# Patient Record
Sex: Male | Born: 1955
Health system: Southern US, Community
[De-identification: ages and names within clinical notes are randomized; demographics above are authoritative.]

## PROBLEM LIST (undated history)

## (undated) DIAGNOSIS — E785 Hyperlipidemia, unspecified: Secondary | ICD-10-CM

## (undated) DIAGNOSIS — I10 Essential (primary) hypertension: Secondary | ICD-10-CM

## (undated) DIAGNOSIS — E079 Disorder of thyroid, unspecified: Secondary | ICD-10-CM

## (undated) DIAGNOSIS — T7840XA Allergy, unspecified, initial encounter: Secondary | ICD-10-CM

## (undated) DIAGNOSIS — E559 Vitamin D deficiency, unspecified: Secondary | ICD-10-CM

## (undated) DIAGNOSIS — E0500 Thyrotoxicosis with diffuse goiter without thyrotoxic crisis or storm: Secondary | ICD-10-CM

## (undated) DIAGNOSIS — Z86010 Personal history of colonic polyps: Secondary | ICD-10-CM

## (undated) HISTORY — DX: Vitamin D deficiency, unspecified: E55.9

## (undated) HISTORY — DX: Essential (primary) hypertension: I10

## (undated) HISTORY — DX: Disorder of thyroid, unspecified: E07.9

## (undated) HISTORY — PX: TONSILLECTOMY AND ADENOIDECTOMY: SUR1326

## (undated) HISTORY — DX: Thyrotoxicosis with diffuse goiter without thyrotoxic crisis or storm: E05.00

## (undated) HISTORY — DX: Hyperlipidemia, unspecified: E78.5

## (undated) HISTORY — DX: Allergy, unspecified, initial encounter: T78.40XA

## (undated) HISTORY — DX: Personal history of colonic polyps: Z86.010

---

## 1997-05-06 ENCOUNTER — Ambulatory Visit (HOSPITAL_COMMUNITY): Admission: RE | Admit: 1997-05-06 | Discharge: 1997-05-06 | Payer: Self-pay | Admitting: *Deleted

## 2001-05-24 ENCOUNTER — Ambulatory Visit (HOSPITAL_COMMUNITY): Admission: RE | Admit: 2001-05-24 | Discharge: 2001-05-24 | Payer: Self-pay | Admitting: Anesthesiology

## 2001-05-24 ENCOUNTER — Encounter: Payer: Self-pay | Admitting: Internal Medicine

## 2002-05-28 ENCOUNTER — Ambulatory Visit (HOSPITAL_COMMUNITY): Admission: RE | Admit: 2002-05-28 | Discharge: 2002-05-28 | Payer: Self-pay | Admitting: Internal Medicine

## 2002-05-28 ENCOUNTER — Encounter: Payer: Self-pay | Admitting: Internal Medicine

## 2007-09-05 ENCOUNTER — Ambulatory Visit (HOSPITAL_COMMUNITY): Admission: RE | Admit: 2007-09-05 | Discharge: 2007-09-05 | Payer: Self-pay | Admitting: Internal Medicine

## 2007-09-25 ENCOUNTER — Ambulatory Visit: Payer: Self-pay

## 2010-09-23 ENCOUNTER — Other Ambulatory Visit (HOSPITAL_COMMUNITY): Payer: Self-pay | Admitting: Internal Medicine

## 2010-09-23 ENCOUNTER — Ambulatory Visit (HOSPITAL_COMMUNITY)
Admission: RE | Admit: 2010-09-23 | Discharge: 2010-09-23 | Disposition: A | Discharge status: Home / Self Care | Payer: Managed Care, Other (non HMO) | Source: Ambulatory Visit | Attending: Internal Medicine | Admitting: Internal Medicine

## 2010-09-23 DIAGNOSIS — I10 Essential (primary) hypertension: Secondary | ICD-10-CM | POA: Insufficient documentation

## 2010-09-23 DIAGNOSIS — Z0000 Encounter for general adult medical examination without abnormal findings: Secondary | ICD-10-CM | POA: Insufficient documentation

## 2012-09-26 ENCOUNTER — Other Ambulatory Visit (HOSPITAL_COMMUNITY): Payer: Self-pay | Admitting: Internal Medicine

## 2012-09-26 ENCOUNTER — Ambulatory Visit (HOSPITAL_COMMUNITY)
Admission: RE | Admit: 2012-09-26 | Discharge: 2012-09-26 | Disposition: A | Discharge status: Home / Self Care | Payer: 59 | Source: Ambulatory Visit | Attending: Internal Medicine | Admitting: Internal Medicine

## 2012-09-26 DIAGNOSIS — R05 Cough: Secondary | ICD-10-CM

## 2012-09-26 DIAGNOSIS — J984 Other disorders of lung: Secondary | ICD-10-CM | POA: Insufficient documentation

## 2012-09-26 DIAGNOSIS — R059 Cough, unspecified: Secondary | ICD-10-CM

## 2012-10-01 ENCOUNTER — Other Ambulatory Visit: Payer: Self-pay | Admitting: Internal Medicine

## 2012-10-01 DIAGNOSIS — I890 Lymphedema, not elsewhere classified: Secondary | ICD-10-CM

## 2012-10-01 DIAGNOSIS — E049 Nontoxic goiter, unspecified: Secondary | ICD-10-CM

## 2012-10-02 ENCOUNTER — Ambulatory Visit
Admission: RE | Admit: 2012-10-02 | Discharge: 2012-10-02 | Disposition: A | Discharge status: Home / Self Care | Payer: 59 | Source: Ambulatory Visit | Attending: Internal Medicine | Admitting: Internal Medicine

## 2012-10-02 DIAGNOSIS — E049 Nontoxic goiter, unspecified: Secondary | ICD-10-CM

## 2012-10-02 DIAGNOSIS — I890 Lymphedema, not elsewhere classified: Secondary | ICD-10-CM

## 2013-03-27 DIAGNOSIS — E785 Hyperlipidemia, unspecified: Secondary | ICD-10-CM | POA: Insufficient documentation

## 2013-03-27 DIAGNOSIS — I10 Essential (primary) hypertension: Secondary | ICD-10-CM | POA: Insufficient documentation

## 2013-03-27 DIAGNOSIS — E039 Hypothyroidism, unspecified: Secondary | ICD-10-CM | POA: Insufficient documentation

## 2013-03-27 DIAGNOSIS — E559 Vitamin D deficiency, unspecified: Secondary | ICD-10-CM | POA: Insufficient documentation

## 2013-03-31 DIAGNOSIS — Z79899 Other long term (current) drug therapy: Secondary | ICD-10-CM | POA: Insufficient documentation

## 2013-03-31 NOTE — Progress Notes (Signed)
Patient ID: Julian Lee, male   DOB: 1955/02/10, 58 y.o.   MRN: [id number]      This very nice 58 y.o. male presents for 6 month follow up with Hypertension, Hyperlipidemia, Pre-Diabetes Screening and Vitamin D Deficiency.    HTN predates since the 1990's. BP has been controlled at home. Today's BP: 134/82 mmHg . Patient denies any cardiac type chest pain, palpitations, dyspnea/orthopnea/PND, dizziness, claudication, or dependent edema. Exercises 5-7 days/week.   Hyperlipidemia is controlled with diet. Last Cholesterol was 133, Triglycerides were  73, HDL 51 and LDL 67 in Sept 2014 - all at goal. Patient denies myalgias or other med SE's.    Also, the patient is screened for PreDiabetes/insulin resistance with last A1c of 5.3% in Sept 2014. Patient denies any symptoms of reactive hypoglycemia, diabetic polys, paresthesias or visual blurring.   Further, Patient has history of Vitamin D Deficiency with last vitamin D of 74 in Sept 2014. Patient supplements vitamin D without any suspected side-effects.  Medication Sig  . BABY ASPIRIN PO Take 81 mg by mouth daily.  . Calcium Carbonate Antacid (TUMS PO) Take by mouth as needed.  . Cholecalciferol (VITAMIN D PO) Take 2,000 Units by mouth 3 (three) times daily.   . Flaxseed, Linseed, (FLAX SEED OIL PO) Take by mouth daily. Takes 2 caps daily  . levothyroxine (SYNTHROID) 150 MCG tablet Take 150 mcg by mouth daily before breakfast.  . losartan (COZAAR) 100 MG tablet Take 100 mg by mouth daily.    No Known Allergies  PMHx:   Past Medical History  Diagnosis Date  . Hypertension   . Hyperlipidemia   . Thyroid disease   . Vitamin D deficiency     FHx:    Reviewed / unchanged  SHx:    Reviewed / unchanged   Systems Review: Constitutional: Denies fever, chills, wt changes, headaches, insomnia, fatigue, night sweats, change in appetite. Eyes: Denies redness, blurred vision, diplopia, discharge, itchy, watery eyes.  ENT: Denies  discharge, congestion, post nasal drip, epistaxis, sore throat, earache, hearing loss, dental pain, tinnitus, vertigo, sinus pain, snoring.  CV: Denies chest pain, palpitations, irregular heartbeat, syncope, dyspnea, diaphoresis, orthopnea, PND, claudication, edema. Respiratory: denies cough, dyspnea, DOE, pleurisy, hoarseness, laryngitis, wheezing.  Gastrointestinal: Denies dysphagia, odynophagia, heartburn, reflux, water brash, abdominal pain or cramps, nausea, vomiting, bloating, diarrhea, constipation, hematemesis, melena, hematochezia,  or hemorrhoids. Genitourinary: Denies dysuria, frequency, urgency, nocturia, hesitancy, discharge, hematuria, flank pain. Musculoskeletal: Denies arthralgias, myalgias, stiffness, jt. swelling, pain, limp, strain/sprain.  Skin: Denies pruritus, rash, hives, warts, acne, eczema, change in skin lesion(s). Neuro: No weakness, tremor, incoordination, spasms, paresthesia, or pain. Psychiatric: Denies confusion, memory loss, or sensory loss. Endo: Denies change in weight, skin, hair change.  Heme/Lymph: No excessive bleeding, bruising, orenlarged lymph nodes.   Exam:  BP 134/82  Pulse 64  Temp(Src) 98.2 F (36.8 C) (Temporal)  Resp 16  Ht 6' 1.75" (1.873 m)  Wt 219 lb 3.2 oz (99.428 kg)  BMI 28.34 kg/m2  Appears well nourished - in no distress. Eyes: PERRLA, EOMs, conjunctiva no swelling or erythema. Sinuses: No frontal/maxillary tenderness ENT/Mouth: EAC's clear, TM's nl w/o erythema, bulging. Nares clear w/o erythema, swelling, exudates. Oropharynx clear without erythema or exudates. Oral hygiene is good. Tongue normal, non obstructing. Hearing intact.  Neck: Supple. Thyroid nl. Car 2+/2+ without bruits, nodes or JVD. Chest: Respirations nl with BS clear & equal w/o rales, rhonchi, wheezing or stridor.  Cor: Heart sounds normal w/ regular rate and rhythm without  sig. murmurs, gallops, clicks, or rubs. Peripheral pulses normal and equal  without edema.   Abdomen: Soft & bowel sounds normal. Non-tender w/o guarding, rebound, hernias, masses, or organomegaly.  Lymphatics: Unremarkable.  Musculoskeletal: Full ROM all peripheral extremities, joint stability, 5/5 strength, and normal gait.  Skin: Warm, dry without exposed rashes, lesions, ecchymosis apparent.  Neuro: Cranial nerves intact, reflexes equal bilaterally. Sensory-motor testing grossly intact. Tendon reflexes grossly intact.  Pysch: Alert & oriented x 3. Insight and judgement nl & appropriate. No ideations.  Assessment and Plan:  1. Hypertension - Continue monitor blood pressure at home. Continue diet/meds same.  2. Hyperlipidemia - Continue diet, exercise,& lifestyle modifications. Continue monitor periodic cholesterol/liver & renal functions   3. Pre-diabetes/Insulin Resistance  screening - Continue diet, exercise, lifestyle modifications. Monitor appropriate labs.  4. Vitamin D Deficiency - Continue supplementation.  5. Hypothyroidism - continue to monitor  Recommended regular exercise, BP monitoring, weight control, and discussed med and SE's. Recommended labs to assess and monitor clinical status. Further disposition pending results of labs.

## 2013-03-31 NOTE — Patient Instructions (Signed)

## 2013-04-01 ENCOUNTER — Encounter: Payer: Self-pay | Admitting: Internal Medicine

## 2013-04-01 ENCOUNTER — Encounter (INDEPENDENT_AMBULATORY_CARE_PROVIDER_SITE_OTHER): Payer: Self-pay

## 2013-04-01 ENCOUNTER — Ambulatory Visit (INDEPENDENT_AMBULATORY_CARE_PROVIDER_SITE_OTHER): Payer: 59 | Admitting: Internal Medicine

## 2013-04-01 VITALS — BP 134/82 | HR 64 | Temp 98.2°F | Resp 16 | Ht 73.75 in | Wt 219.2 lb

## 2013-04-01 DIAGNOSIS — I10 Essential (primary) hypertension: Secondary | ICD-10-CM

## 2013-04-01 DIAGNOSIS — Z79899 Other long term (current) drug therapy: Secondary | ICD-10-CM

## 2013-04-01 DIAGNOSIS — E785 Hyperlipidemia, unspecified: Secondary | ICD-10-CM

## 2013-04-01 DIAGNOSIS — E039 Hypothyroidism, unspecified: Secondary | ICD-10-CM

## 2013-04-01 DIAGNOSIS — Z111 Encounter for screening for respiratory tuberculosis: Secondary | ICD-10-CM

## 2013-04-01 DIAGNOSIS — R7309 Other abnormal glucose: Secondary | ICD-10-CM

## 2013-04-01 DIAGNOSIS — E559 Vitamin D deficiency, unspecified: Secondary | ICD-10-CM

## 2013-04-02 LAB — CBC WITH DIFFERENTIAL/PLATELET
BASOS ABS: 0.1 10*3/uL (ref 0.0–0.1)
BASOS PCT: 1 % (ref 0–1)
EOS ABS: 0.3 10*3/uL (ref 0.0–0.7)
Eosinophils Relative: 5 % (ref 0–5)
HCT: 47.6 % (ref 39.0–52.0)
Hemoglobin: 16 g/dL (ref 13.0–17.0)
Lymphocytes Relative: 17 % (ref 12–46)
Lymphs Abs: 1 10*3/uL (ref 0.7–4.0)
MCH: 30.2 pg (ref 26.0–34.0)
MCHC: 33.6 g/dL (ref 30.0–36.0)
MCV: 89.8 fL (ref 78.0–100.0)
Monocytes Absolute: 0.4 10*3/uL (ref 0.1–1.0)
Monocytes Relative: 6 % (ref 3–12)
NEUTROS PCT: 71 % (ref 43–77)
Neutro Abs: 4.3 10*3/uL (ref 1.7–7.7)
PLATELETS: 208 10*3/uL (ref 150–400)
RBC: 5.3 MIL/uL (ref 4.22–5.81)
RDW: 13.5 % (ref 11.5–15.5)
WBC: 6 10*3/uL (ref 4.0–10.5)

## 2013-04-02 LAB — VITAMIN D 25 HYDROXY (VIT D DEFICIENCY, FRACTURES): Vit D, 25-Hydroxy: 94 ng/mL — ABNORMAL HIGH (ref 30–89)

## 2013-04-02 LAB — LIPID PANEL
CHOL/HDL RATIO: 2.4 ratio
CHOLESTEROL: 152 mg/dL (ref 0–200)
HDL: 64 mg/dL (ref >39)
LDL Cholesterol: 73 mg/dL (ref 0–99)
TRIGLYCERIDES: 74 mg/dL (ref <150)
VLDL: 15 mg/dL (ref 0–40)

## 2013-04-02 LAB — HEMOGLOBIN A1C
Hgb A1c MFr Bld: 5.2 % (ref <5.7)
Mean Plasma Glucose: 103 mg/dL (ref <117)

## 2013-04-02 LAB — HEPATIC FUNCTION PANEL
ALT: 16 U/L (ref 0–53)
AST: 21 U/L (ref 0–37)
Albumin: 4.3 g/dL (ref 3.5–5.2)
Alkaline Phosphatase: 59 U/L (ref 39–117)
BILIRUBIN INDIRECT: 0.5 mg/dL (ref 0.2–1.2)
BILIRUBIN TOTAL: 0.6 mg/dL (ref 0.2–1.2)
Bilirubin, Direct: 0.1 mg/dL (ref 0.0–0.3)
Total Protein: 7 g/dL (ref 6.0–8.3)

## 2013-04-02 LAB — BASIC METABOLIC PANEL WITH GFR
BUN: 22 mg/dL (ref 6–23)
CALCIUM: 9.5 mg/dL (ref 8.4–10.5)
CO2: 26 mEq/L (ref 19–32)
Chloride: 105 mEq/L (ref 96–112)
Creat: 1.15 mg/dL (ref 0.50–1.35)
GFR, EST AFRICAN AMERICAN: 80 mL/min
GFR, EST NON AFRICAN AMERICAN: 69 mL/min
Glucose, Bld: 91 mg/dL (ref 70–99)
Potassium: 4.3 mEq/L (ref 3.5–5.3)
Sodium: 140 mEq/L (ref 135–145)

## 2013-04-02 LAB — MAGNESIUM: Magnesium: 1.9 mg/dL (ref 1.5–2.5)

## 2013-04-02 LAB — INSULIN, FASTING: Insulin fasting, serum: 9 u[IU]/mL (ref 3–28)

## 2013-04-02 LAB — TSH: TSH: 3.932 u[IU]/mL (ref 0.350–4.500)

## 2013-04-03 ENCOUNTER — Telehealth: Payer: Self-pay | Admitting: Internal Medicine

## 2013-04-03 NOTE — Telephone Encounter (Signed)
Pt called to advise TB test negative.

## 2013-04-04 LAB — TB SKIN TEST
INDURATION: 0 mm
TB SKIN TEST: NEGATIVE

## 2013-04-09 ENCOUNTER — Other Ambulatory Visit: Payer: Self-pay | Admitting: Internal Medicine

## 2013-04-09 ENCOUNTER — Other Ambulatory Visit: Payer: Self-pay | Admitting: *Deleted

## 2013-04-09 NOTE — Addendum Note (Signed)
Addended by: Unk Pinto on: 04/09/2013 12:45 PM   Modules accepted: Orders

## 2013-04-16 ENCOUNTER — Other Ambulatory Visit: Payer: 59 | Admitting: Internal Medicine

## 2013-04-16 DIAGNOSIS — I10 Essential (primary) hypertension: Secondary | ICD-10-CM

## 2013-05-21 ENCOUNTER — Other Ambulatory Visit: Payer: Self-pay | Admitting: Internal Medicine

## 2013-05-22 ENCOUNTER — Other Ambulatory Visit: Payer: Self-pay | Admitting: Internal Medicine

## 2013-06-06 ENCOUNTER — Encounter: Payer: Self-pay | Admitting: Internal Medicine

## 2013-07-04 ENCOUNTER — Other Ambulatory Visit: Payer: Self-pay | Admitting: Physician Assistant

## 2013-09-07 ENCOUNTER — Other Ambulatory Visit: Payer: Self-pay | Admitting: Physician Assistant

## 2013-09-26 ENCOUNTER — Encounter: Payer: Self-pay | Admitting: Physician Assistant

## 2013-10-01 ENCOUNTER — Encounter: Payer: Self-pay | Admitting: Physician Assistant

## 2013-10-01 ENCOUNTER — Ambulatory Visit (INDEPENDENT_AMBULATORY_CARE_PROVIDER_SITE_OTHER): Payer: 59 | Admitting: Physician Assistant

## 2013-10-01 VITALS — BP 142/90 | HR 56 | Temp 97.9°F | Resp 16 | Ht 72.5 in | Wt 231.0 lb

## 2013-10-01 DIAGNOSIS — Z0000 Encounter for general adult medical examination without abnormal findings: Secondary | ICD-10-CM

## 2013-10-01 LAB — CBC WITH DIFFERENTIAL/PLATELET
BASOS PCT: 1 % (ref 0–1)
Basophils Absolute: 0.1 10*3/uL (ref 0.0–0.1)
Eosinophils Absolute: 0.4 10*3/uL (ref 0.0–0.7)
Eosinophils Relative: 7 % — ABNORMAL HIGH (ref 0–5)
HCT: 45.6 % (ref 39.0–52.0)
Hemoglobin: 15.5 g/dL (ref 13.0–17.0)
LYMPHS ABS: 1 10*3/uL (ref 0.7–4.0)
Lymphocytes Relative: 19 % (ref 12–46)
MCH: 30.1 pg (ref 26.0–34.0)
MCHC: 34 g/dL (ref 30.0–36.0)
MCV: 88.5 fL (ref 78.0–100.0)
Monocytes Absolute: 0.6 10*3/uL (ref 0.1–1.0)
Monocytes Relative: 12 % (ref 3–12)
NEUTROS PCT: 61 % (ref 43–77)
Neutro Abs: 3.2 10*3/uL (ref 1.7–7.7)
PLATELETS: 217 10*3/uL (ref 150–400)
RBC: 5.15 MIL/uL (ref 4.22–5.81)
RDW: 13.7 % (ref 11.5–15.5)
WBC: 5.3 10*3/uL (ref 4.0–10.5)

## 2013-10-01 NOTE — Progress Notes (Signed)
Complete Physical  Assessment and Plan: Hypertension- - continue medications, DASH diet, exercise and monitor at home. Call if greater than 140/90. ? White coat syndrome  Hyperlipidemia--continue medications, check lipids, decrease fatty foods, increase activity.   -Hypothyroidism-check TSH level, continue medications the same.   Vitamin D deficiency- cont supplement  Get colonoscopy Get eye exam  Discussed med's effects and SE's. Screening labs and tests as requested with regular follow-up as recommended.  HPI He has had elevated blood pressure for 15 + years. His blood pressure has not been checked at home but he admits to white coat syndrome, he is on losartan 100mg  daily, today their BP is BP: 142/90 mmHg He does workout, does cardio 45 mins daily. He denies chest pain, shortness of breath, dizziness.  He is not on cholesterol medication and denies myalgias. His cholesterol is at goal. The cholesterol last visit was:   Lab Results  Component Value Date   CHOL 152 04/01/2013   HDL 64 04/01/2013   LDLCALC 73 04/01/2013   TRIG 74 04/01/2013   CHOLHDL 2.4 04/01/2013    Last A1C in the office was:  Lab Results  Component Value Date   HGBA1C 5.2 04/01/2013   Patient is on Vitamin D supplement.   Lab Results  Component Value Date   VD25OH 31* 04/01/2013     He is on thyroid medication. His medication was not changed last visit. Patient denies nervousness, palpitations and weight changes.  Lab Results  Component Value Date   TSH 3.932 04/01/2013  .    Current Medications:  Current Outpatient Prescriptions on File Prior to Visit  Medication Sig Dispense Refill  . BABY ASPIRIN PO Take 81 mg by mouth daily.      . Calcium Carbonate Antacid (TUMS PO) Take by mouth as needed.      . Cholecalciferol (VITAMIN D PO) Take 2,000 Units by mouth 3 (three) times daily.       . Flaxseed, Linseed, (FLAX SEED OIL PO) Take by mouth daily. Takes 2 caps daily      . levothyroxine (SYNTHROID,  LEVOTHROID) 150 MCG tablet Take 1 tablet by mouth  daily  30 tablet  0  . losartan (COZAAR) 100 MG tablet Take 1 tablet by mouth  every day for blood  pressure  90 tablet  2   No current facility-administered medications on file prior to visit.   Health Maintenance:  Immunization History  Administered Date(s) Administered  . Influenza Split 09/26/2012  . PPD Test 04/01/2013  . Pneumococcal Polysaccharide-23 09/16/2008  . Td 08/22/2006  . Zoster 01/10/2006   Tetanus: 2008 Pneumovax: 2010 Flu vaccine: 2014 DUE will get at work Zostavax: 2008 DEXA: Colonoscopy:2005 EGD: Cardiolite negative 2009 DEE: 5 years ago  Allergies: No Known Allergies Medical History:  Past Medical History  Diagnosis Date  . Hypertension   . Hyperlipidemia   . Thyroid disease   . Vitamin D deficiency    Surgical History:  Past Surgical History  Procedure Laterality Date  . Tonsillectomy and adenoidectomy     Family History:  Family History  Problem Relation Age of Onset  . Cancer Mother     lung  . Heart attack Father    Social History:   History  Substance Use Topics  . Smoking status: Former Research scientist (life sciences)  . Smokeless tobacco: Former Systems developer    Quit date: 01/10/1990  . Alcohol Use: Yes     Comment: Rare   ROS:  [X]  = complains of  [ ]  =  denies  General: Fatigue [ ]  Fever [ ]  Chills [ ]  Weakness [ ]   Insomnia [ ]  Weight change [ ]  Night sweats [ ]   Change in appetite [ ]  Eyes: Redness [ ]  Blurred vision [ ]  Diplopia [ ]  Discharge [ ]   ENT: Congestion [ ]  Sinus Pain [ ]  Post Nasal Drip [ ]  Sore Throat [ ]  Earache [ ]  hearing loss [ ]  Tinnitus [ ]  Snoring [ ]   Cardiac: Chest pain/pressure [ ]  SOB [ ]  Orthopnea [ ]   Palpitations [ ]   Paroxysmal nocturnal dyspnea[ ]  Claudication [ ]  Edema [ ]   Pulmonary: Cough [ ]  Wheezing[ ]   SOB [ ]   Pleurisy [ ]   GI: Nausea [ ]  Vomiting[ ]  Dysphagia[ ]  Heartburn[ ]  Abdominal pain [ ]  Constipation [ ] ; Diarrhea [ ]  BRBPR [ ]  Melena[ ]  Bloating [ ]  Hemorrhoids [ ]    GU: Hematuria[ ]  Dysuria [ ]  Nocturia[ ]  Urgency [ ]   Hesitancy [ ]  Discharge [ ]  Frequency [ ]   Neuro: Headaches[ ]  Vertigo[ ]  Paresthesias[ ]  Spasm [ ]  Speech changes [ ]  Incoordination [ ]   Ortho: Arthritis [ ]  Joint pain [ ]  Muscle pain [ ]  Joint swelling [ ]  Back Pain [ ]  Skin:  Rash [ ]   Pruritis [ ]  Change in skin lesion [ ]   Psych: Depression[ ]  Anxiety[ ]  Confusion [ ]  Memory loss [ ]   Heme/Lypmh: Bleeding [ ]  Bruising [ ]  Enlarged lymph nodes [ ]   Endocrine: Visual blurring [ ]  Paresthesia [ ]  Polyuria [ ]  Polydypsea [ ]    Heat/cold intolerance [ ]  Hypoglycemia [ ]   Physical Exam: Estimated body mass index is 30.88 kg/(m^2) as calculated from the following:   Height as of this encounter: 6' 0.5" (1.842 m).   Weight as of this encounter: 231 lb (104.781 kg). BP 142/90  Pulse 56  Temp(Src) 97.9 F (36.6 C)  Resp 16  Ht 6' 0.5" (1.842 m)  Wt 231 lb (104.781 kg)  BMI 30.88 kg/m2 General Appearance: Well nourished, in no apparent distress. Eyes: PERRLA, EOMs, conjunctiva no swelling or erythema, normal fundi and vessels. Sinuses: No Frontal/maxillary tenderness ENT/Mouth: Ext aud canals clear, normal light reflex with TMs without erythema, bulging. Good dentition. No erythema, swelling, or exudate on post pharynx. Tonsils not swollen or erythematous. Hearing normal.  Neck: Supple, thyroid normal. No bruits Respiratory: Respiratory effort normal, BS equal bilaterally without rales, rhonchi, wheezing or stridor. Cardio: RRR without murmurs, rubs or gallops. Brisk peripheral pulses without edema.  Chest: symmetric, with normal excursions and percussion. Abdomen: Soft, +BS. Non tender, no guarding, rebound, hernias, masses, or organomegaly. .  Lymphatics: Non tender without lymphadenopathy.  Genitourinary: defer Musculoskeletal: Full ROM all peripheral extremities,5/5 strength, and normal gait. Skin: Warm, dry without rashes, lesions, ecchymosis.  Neuro: Cranial nerves intact,  reflexes equal bilaterally. Normal muscle tone, no cerebellar symptoms. Sensation intact.  Psych: Awake and oriented X 3, normal affect, Insight and Judgment appropriate.   EKG: done 03/2013 normal  Vicie Mutters 3:56 PM

## 2013-10-01 NOTE — Patient Instructions (Signed)
Please call Dr. Celesta Aver office to schedule colonoscopy Monitor BP if it is still elevated  Above 140/90 call the office.   DASH Eating Plan DASH stands for "Dietary Approaches to Stop Hypertension." The DASH eating plan is a healthy eating plan that has been shown to reduce high blood pressure (hypertension). Additional health benefits may include reducing the risk of type 2 diabetes mellitus, heart disease, and stroke. The DASH eating plan may also help with weight loss. WHAT DO I NEED TO KNOW ABOUT THE DASH EATING PLAN? For the DASH eating plan, you will follow these general guidelines:  Choose foods with a percent daily value for sodium of less than 5% (as listed on the food label).  Use salt-free seasonings or herbs instead of table salt or sea salt.  Check with your health care provider or pharmacist before using salt substitutes.  Eat lower-sodium products, often labeled as "lower sodium" or "no salt added."  Eat fresh foods.  Eat more vegetables, fruits, and low-fat dairy products.  Choose whole grains. Look for the word "whole" as the first word in the ingredient list.  Choose fish and skinless chicken or [place] more often than red meat. Limit fish, poultry, and meat to 6 oz (170 g) each day.  Limit sweets, desserts, sugars, and sugary drinks.  Choose heart-healthy fats.  Limit cheese to 1 oz (28 g) per day.  Eat more home-cooked food and less restaurant, buffet, and fast food.  Limit fried foods.  Cook foods using methods other than frying.  Limit canned vegetables. If you do use them, rinse them well to decrease the sodium.  When eating at a restaurant, ask that your food be prepared with less salt, or no salt if possible. WHAT FOODS CAN I EAT? Seek help from a dietitian for individual calorie needs. Grains Whole grain or whole wheat bread. Brown rice. Whole grain or whole wheat pasta. Quinoa, bulgur, and whole grain cereals. Low-sodium cereals. Corn or whole  wheat flour tortillas. Whole grain cornbread. Whole grain crackers. Low-sodium crackers. Vegetables Fresh or frozen vegetables (raw, steamed, roasted, or grilled). Low-sodium or reduced-sodium tomato and vegetable juices. Low-sodium or reduced-sodium tomato sauce and paste. Low-sodium or reduced-sodium canned vegetables.  Fruits All fresh, canned (in natural juice), or frozen fruits. Meat and Other Protein Products Ground beef (85% or leaner), grass-fed beef, or beef trimmed of fat. Skinless chicken or [place]. Ground chicken or [place]. Pork trimmed of fat. All fish and seafood. Eggs. Dried beans, peas, or lentils. Unsalted nuts and seeds. Unsalted canned beans. Dairy Low-fat dairy products, such as skim or 1% milk, 2% or reduced-fat cheeses, low-fat ricotta or cottage cheese, or plain low-fat yogurt. Low-sodium or reduced-sodium cheeses. Fats and Oils Tub margarines without trans fats. Light or reduced-fat mayonnaise and salad dressings (reduced sodium). Avocado. Safflower, olive, or canola oils. Natural peanut or almond butter. Other Unsalted popcorn and pretzels. The items listed above may not be a complete list of recommended foods or beverages. Contact your dietitian for more options. WHAT FOODS ARE NOT RECOMMENDED? Grains White bread. White pasta. White rice. Refined cornbread. Bagels and croissants. Crackers that contain trans fat. Vegetables Creamed or fried vegetables. Vegetables in a cheese sauce. Regular canned vegetables. Regular canned tomato sauce and paste. Regular tomato and vegetable juices. Fruits Dried fruits. Canned fruit in light or heavy syrup. Fruit juice. Meat and Other Protein Products Fatty cuts of meat. Ribs, chicken wings, bacon, sausage, bologna, salami, chitterlings, fatback, hot dogs, bratwurst, and packaged luncheon meats.  Salted nuts and seeds. Canned beans with salt. Dairy Whole or 2% milk, cream, half-and-half, and cream cheese. Whole-fat or sweetened yogurt.  Full-fat cheeses or blue cheese. Nondairy creamers and whipped toppings. Processed cheese, cheese spreads, or cheese curds. Condiments Onion and garlic salt, seasoned salt, table salt, and sea salt. Canned and packaged gravies. Worcestershire sauce. Tartar sauce. Barbecue sauce. Teriyaki sauce. Soy sauce, including reduced sodium. Steak sauce. Fish sauce. Oyster sauce. Cocktail sauce. Horseradish. Ketchup and mustard. Meat flavorings and tenderizers. Bouillon cubes. Hot sauce. Tabasco sauce. Marinades. Taco seasonings. Relishes. Fats and Oils Butter, stick margarine, lard, shortening, ghee, and bacon fat. Coconut, palm kernel, or palm oils. Regular salad dressings. Other Pickles and olives. Salted popcorn and pretzels. The items listed above may not be a complete list of foods and beverages to avoid. Contact your dietitian for more information. WHERE CAN I FIND MORE INFORMATION? National Heart, Lung, and Blood Institute: travelstabloid.com Document Released: 12/16/2010 Document Revised: 05/13/2013 Document Reviewed: 10/31/2012 [employer] Patient Information 2015 [place], [place]. This information is not intended to replace advice given to you by your health care provider. Make sure you discuss any questions you have with your health care provider.

## 2013-10-02 LAB — MICROALBUMIN / CREATININE URINE RATIO
Creatinine, Urine: 123.8 mg/dL
MICROALB UR: 4.2 mg/dL — AB (ref <2.0)
MICROALB/CREAT RATIO: 33.9 mg/g — AB (ref 0.0–30.0)

## 2013-10-02 LAB — BASIC METABOLIC PANEL WITH GFR
BUN: 11 mg/dL (ref 6–23)
CALCIUM: 9.6 mg/dL (ref 8.4–10.5)
CO2: 28 meq/L (ref 19–32)
Chloride: 102 mEq/L (ref 96–112)
Creat: 1.09 mg/dL (ref 0.50–1.35)
GFR, EST AFRICAN AMERICAN: 85 mL/min
GFR, Est Non African American: 73 mL/min
Glucose, Bld: 82 mg/dL (ref 70–99)
Potassium: 4.5 mEq/L (ref 3.5–5.3)
SODIUM: 139 meq/L (ref 135–145)

## 2013-10-02 LAB — HEMOGLOBIN A1C
HEMOGLOBIN A1C: 5.2 % (ref <5.7)
Mean Plasma Glucose: 103 mg/dL (ref <117)

## 2013-10-02 LAB — IRON AND TIBC
%SAT: 30 % (ref 20–55)
Iron: 87 ug/dL (ref 42–165)
TIBC: 287 ug/dL (ref 215–435)
UIBC: 200 ug/dL (ref 125–400)

## 2013-10-02 LAB — HEPATIC FUNCTION PANEL
ALT: 17 U/L (ref 0–53)
AST: 24 U/L (ref 0–37)
Albumin: 4.4 g/dL (ref 3.5–5.2)
Alkaline Phosphatase: 58 U/L (ref 39–117)
BILIRUBIN DIRECT: 0.2 mg/dL (ref 0.0–0.3)
BILIRUBIN TOTAL: 0.6 mg/dL (ref 0.2–1.2)
Indirect Bilirubin: 0.4 mg/dL (ref 0.2–1.2)
Total Protein: 7.1 g/dL (ref 6.0–8.3)

## 2013-10-02 LAB — URINALYSIS, ROUTINE W REFLEX MICROSCOPIC
BILIRUBIN URINE: NEGATIVE
GLUCOSE, UA: NEGATIVE mg/dL
Hgb urine dipstick: NEGATIVE
Ketones, ur: NEGATIVE mg/dL
Leukocytes, UA: NEGATIVE
Nitrite: NEGATIVE
Protein, ur: NEGATIVE mg/dL
Specific Gravity, Urine: 1.017 (ref 1.005–1.030)
Urobilinogen, UA: 0.2 mg/dL (ref 0.0–1.0)
pH: 7 (ref 5.0–8.0)

## 2013-10-02 LAB — LIPID PANEL
Cholesterol: 146 mg/dL (ref 0–200)
HDL: 54 mg/dL (ref >39)
LDL CALC: 75 mg/dL (ref 0–99)
Total CHOL/HDL Ratio: 2.7 Ratio
Triglycerides: 84 mg/dL (ref <150)
VLDL: 17 mg/dL (ref 0–40)

## 2013-10-02 LAB — PSA: PSA: 0.7 ng/mL (ref <=4.00)

## 2013-10-02 LAB — VITAMIN D 25 HYDROXY (VIT D DEFICIENCY, FRACTURES): Vit D, 25-Hydroxy: 82 ng/mL (ref 30–89)

## 2013-10-02 LAB — VITAMIN B12: VITAMIN B 12: 930 pg/mL — AB (ref 211–911)

## 2013-10-02 LAB — TSH: TSH: 4.955 u[IU]/mL — ABNORMAL HIGH (ref 0.350–4.500)

## 2013-10-02 LAB — INSULIN, FASTING: Insulin fasting, serum: 3.1 u[IU]/mL (ref 2.0–19.6)

## 2013-10-02 LAB — MAGNESIUM: Magnesium: 2 mg/dL (ref 1.5–2.5)

## 2013-11-06 ENCOUNTER — Ambulatory Visit (INDEPENDENT_AMBULATORY_CARE_PROVIDER_SITE_OTHER): Payer: 59 | Admitting: *Deleted

## 2013-11-06 DIAGNOSIS — E039 Hypothyroidism, unspecified: Secondary | ICD-10-CM

## 2013-11-06 LAB — TSH: TSH: 1.353 u[IU]/mL (ref 0.350–4.500)

## 2013-11-06 NOTE — Progress Notes (Signed)
Patient ID: Julian Lee, male   DOB: 1955/09/03, 58 y.o.   MRN: [id number] Patient presents for 1 month TSH recheck.  Patient currently taking 150 mcg 1 pill daily and 1.5 pills Sat and 1.5 pills Wednesday.

## 2013-12-11 ENCOUNTER — Other Ambulatory Visit: Payer: Self-pay

## 2013-12-11 MED ORDER — LEVOTHYROXINE SODIUM 150 MCG PO TABS: ORAL_TABLET | ORAL | Status: DC

## 2014-01-03 ENCOUNTER — Other Ambulatory Visit: Payer: Self-pay | Admitting: Physician Assistant

## 2014-04-02 ENCOUNTER — Encounter: Payer: Self-pay | Admitting: Internal Medicine

## 2014-04-06 ENCOUNTER — Encounter: Payer: Self-pay | Admitting: Internal Medicine

## 2014-04-06 DIAGNOSIS — R7309 Other abnormal glucose: Secondary | ICD-10-CM | POA: Insufficient documentation

## 2014-04-06 NOTE — Patient Instructions (Signed)

## 2014-04-06 NOTE — Progress Notes (Signed)
Patient ID: Julian Lee, male   DOB: [birth year], 59 y.o.   MRN: [id number]   This very nice 59 y.o.male presents for 3 month follow up with Hypertension, Hyperlipidemia, Pre-Diabetes and Vitamin D Deficiency. Patient had a Colonoscopy in 2005 and is aware he is overdue in rescheduling. He did receive a letter from Dr Celesta Aver office and states he has difficulty finding the time to schedule the procedure.   Patient is treated for HTN & BP has been controlled at home. Today's BP: (!) 142/98 mmHg. In 2009 Patient had a Negative Cardiolite. Patient has had no complaints of any cardiac type chest pain, palpitations, dyspnea/orthopnea/PND, dizziness, claudication, or dependent edema.   Hyperlipidemia is controlled with diet & meds. Patient denies myalgias or other med SE's. Last Lipids were at goal - Total  Chol 146; HDL 54; LDL 75; Triglycerides 84 on 10/01/2013.   Also, the patient has Morbid Obesity (BMI 30.88) is screened for PreDiabetes and has had no symptoms of reactive hypoglycemia, diabetic polys, paresthesias or visual blurring.  Last A1c was  5.2% on  10/01/2013   Further, the patient also has history of Vitamin D Deficiency and supplements vitamin D without any suspected side-effects. Last vitamin D was  82 on  10/01/2013.  Medication Sig  . BABY ASPIRIN Take  daily.  . Calcium / TUMS  Take  as needed.  Marland Kitchen VITAMIN D  Take 2,000 Units  3  times daily.   Marland Kitchen FLAX SEED OIL  Take 2 caps daily  . levothyroxine  150 MCG tablet Take 1 tab M, T, Thurs and Fri, Take 1.5 tab Wed and Sat  . losartan (COZAAR) 100 MG tablet Take 1 tab  every day for blood  pressure   No Known Allergies  PMHx:   Past Medical History  Diagnosis Date  . Hypertension   . Hyperlipidemia   . Thyroid disease   . Vitamin D deficiency    Immunization History  Administered Date(s) Administered  . Influenza Split 09/26/2012  . PPD Test 04/01/2013  . Pneumococcal Polysaccharide-23 09/16/2008  . Td 08/22/2006  .  Zoster 01/10/2006   Past Surgical History  Procedure Laterality Date  . Tonsillectomy and adenoidectomy     FHx:    Reviewed / unchanged  SHx:    Reviewed / unchanged  Systems Review:  Constitutional: Denies fever, chills, wt changes, headaches, insomnia, fatigue, night sweats, change in appetite. Eyes: Denies redness, blurred vision, diplopia, discharge, itchy, watery eyes.  ENT: Denies discharge, congestion, post nasal drip, epistaxis, sore throat, earache, hearing loss, dental pain, tinnitus, vertigo, sinus pain, snoring.  CV: Denies chest pain, palpitations, irregular heartbeat, syncope, dyspnea, diaphoresis, orthopnea, PND, claudication or edema. Respiratory: denies cough, dyspnea, DOE, pleurisy, hoarseness, laryngitis, wheezing.  Gastrointestinal: Denies dysphagia, odynophagia, heartburn, reflux, water brash, abdominal pain or cramps, nausea, vomiting, bloating, diarrhea, constipation, hematemesis, melena, hematochezia  or hemorrhoids. Genitourinary: Denies dysuria, frequency, urgency, nocturia, hesitancy, discharge, hematuria or flank pain. Musculoskeletal: Denies arthralgias, myalgias, stiffness, jt. swelling, pain, limping or strain/sprain.  Skin: Denies pruritus, rash, hives, warts, acne, eczema or change in skin lesion(s). Neuro: No weakness, tremor, incoordination, spasms, paresthesia or pain. Psychiatric: Denies confusion, memory loss or sensory loss. Endo: Denies change in weight, skin or hair change.  Heme/Lymph: No excessive bleeding, bruising or enlarged lymph nodes.  Physical Exam  BP 142/98   Pulse 72  Temp 97.9 F   Resp 16  Ht 6' 0.5"   Wt 245 lb 12.8 oz  BMI 32.86   Appears well nourished and in no distress. Eyes: PERRLA, EOMs, conjunctiva no swelling or erythema. Sinuses: No frontal/maxillary tenderness ENT/Mouth: EAC's clear, TM's nl w/o erythema, bulging. Nares clear w/o erythema, swelling, exudates. Oropharynx clear without erythema or exudates. Oral  hygiene is good. Tongue normal, non obstructing. Hearing intact.  Neck: Supple. Thyroid nl. Car 2+/2+ without bruits, nodes or JVD. Chest: Respirations nl with BS clear & equal w/o rales, rhonchi, wheezing or stridor.  Cor: Heart sounds normal w/ regular rate and rhythm without sig. murmurs, gallops, clicks, or rubs. Peripheral pulses normal and equal  without edema.  Abdomen: Soft & bowel sounds normal. Non-tender w/o guarding, rebound, hernias, masses, or organomegaly.  Lymphatics: Unremarkable.  Musculoskeletal: Full ROM all peripheral extremities, joint stability, 5/5 strength, and normal gait.  Skin: Warm, dry without exposed rashes, lesions or ecchymosis apparent.  Neuro: Cranial nerves intact, reflexes equal bilaterally. Sensory-motor testing grossly intact. Tendon reflexes grossly intact.  Pysch: Alert & oriented x 3.  Insight and judgement nl & appropriate. No ideations.  Assessment and Plan:  1. Essential hypertension  - losartan-hydrochlorothiazide (HYZAAR) 100-25 MG per tablet; Take 1 tablet daily for BP  Dispense: 90 tablet; Refill: 3  -Encouraged more frequent BP monitoring  2. Hyperlipidemia  - Lipid panel  3. Abnormal glucose  - Hemoglobin A1c - Insulin, random  4. Vitamin D deficiency  - Vit D  25 hydroxy   5. Hypothyroidism  - TSH  6. Morbid obesity (BMI 30.88)   7. Medication management  - CBC with Differential/Platelet - BASIC METABOLIC PANEL WITH GFR - Hepatic function panel - Magnesium   Recommended regular exercise, BP monitoring, weight control, and discussed med and SE's. Recommended labs to assess and monitor clinical status. Further disposition pending results of labs. Over 30 minutes of exam, counseling, chart review was performed

## 2014-04-07 ENCOUNTER — Encounter: Payer: Self-pay | Admitting: Internal Medicine

## 2014-04-07 ENCOUNTER — Other Ambulatory Visit: Payer: Self-pay | Admitting: *Deleted

## 2014-04-07 ENCOUNTER — Ambulatory Visit (INDEPENDENT_AMBULATORY_CARE_PROVIDER_SITE_OTHER): Payer: 59 | Admitting: Internal Medicine

## 2014-04-07 VITALS — BP 142/98 | HR 72 | Temp 97.9°F | Resp 16 | Ht 72.5 in | Wt 245.8 lb

## 2014-04-07 DIAGNOSIS — I10 Essential (primary) hypertension: Secondary | ICD-10-CM

## 2014-04-07 DIAGNOSIS — E039 Hypothyroidism, unspecified: Secondary | ICD-10-CM

## 2014-04-07 DIAGNOSIS — Z79899 Other long term (current) drug therapy: Secondary | ICD-10-CM

## 2014-04-07 DIAGNOSIS — E785 Hyperlipidemia, unspecified: Secondary | ICD-10-CM

## 2014-04-07 DIAGNOSIS — E559 Vitamin D deficiency, unspecified: Secondary | ICD-10-CM

## 2014-04-07 DIAGNOSIS — R7309 Other abnormal glucose: Secondary | ICD-10-CM

## 2014-04-07 LAB — CBC WITH DIFFERENTIAL/PLATELET
BASOS ABS: 0.1 10*3/uL (ref 0.0–0.1)
BASOS PCT: 1 % (ref 0–1)
EOS ABS: 0.4 10*3/uL (ref 0.0–0.7)
Eosinophils Relative: 7 % — ABNORMAL HIGH (ref 0–5)
HCT: 46.9 % (ref 39.0–52.0)
Hemoglobin: 15.5 g/dL (ref 13.0–17.0)
Lymphocytes Relative: 18 % (ref 12–46)
Lymphs Abs: 0.9 10*3/uL (ref 0.7–4.0)
MCH: 29.5 pg (ref 26.0–34.0)
MCHC: 33 g/dL (ref 30.0–36.0)
MCV: 89.3 fL (ref 78.0–100.0)
MONO ABS: 0.6 10*3/uL (ref 0.1–1.0)
MPV: 10 fL (ref 8.6–12.4)
Monocytes Relative: 12 % (ref 3–12)
Neutro Abs: 3.2 10*3/uL (ref 1.7–7.7)
Neutrophils Relative %: 62 % (ref 43–77)
PLATELETS: 222 10*3/uL (ref 150–400)
RBC: 5.25 MIL/uL (ref 4.22–5.81)
RDW: 13.9 % (ref 11.5–15.5)
WBC: 5.1 10*3/uL (ref 4.0–10.5)

## 2014-04-07 LAB — BASIC METABOLIC PANEL WITH GFR
BUN: 21 mg/dL (ref 6–23)
CO2: 24 meq/L (ref 19–32)
CREATININE: 1 mg/dL (ref 0.50–1.35)
Calcium: 8.8 mg/dL (ref 8.4–10.5)
Chloride: 104 mEq/L (ref 96–112)
GFR, Est Non African American: 81 mL/min
GLUCOSE: 87 mg/dL (ref 70–99)
Potassium: 4.1 mEq/L (ref 3.5–5.3)
Sodium: 138 mEq/L (ref 135–145)

## 2014-04-07 LAB — LIPID PANEL
Cholesterol: 137 mg/dL (ref 0–200)
HDL: 48 mg/dL (ref >=40)
LDL Cholesterol: 75 mg/dL (ref 0–99)
TRIGLYCERIDES: 70 mg/dL (ref <150)
Total CHOL/HDL Ratio: 2.9 Ratio
VLDL: 14 mg/dL (ref 0–40)

## 2014-04-07 LAB — HEMOGLOBIN A1C
HEMOGLOBIN A1C: 5.5 % (ref <5.7)
Mean Plasma Glucose: 111 mg/dL (ref <117)

## 2014-04-07 LAB — HEPATIC FUNCTION PANEL
ALBUMIN: 3.9 g/dL (ref 3.5–5.2)
ALT: 20 U/L (ref 0–53)
AST: 20 U/L (ref 0–37)
Alkaline Phosphatase: 63 U/L (ref 39–117)
Bilirubin, Direct: 0.2 mg/dL (ref 0.0–0.3)
Indirect Bilirubin: 0.5 mg/dL (ref 0.2–1.2)
TOTAL PROTEIN: 6.3 g/dL (ref 6.0–8.3)
Total Bilirubin: 0.7 mg/dL (ref 0.2–1.2)

## 2014-04-07 LAB — MAGNESIUM: MAGNESIUM: 1.7 mg/dL (ref 1.5–2.5)

## 2014-04-07 LAB — TSH: TSH: 0.589 u[IU]/mL (ref 0.350–4.500)

## 2014-04-07 MED ORDER — LOSARTAN POTASSIUM 100 MG PO TABS: ORAL_TABLET | ORAL | Status: DC

## 2014-04-07 MED ORDER — LOSARTAN POTASSIUM-HCTZ 100-25 MG PO TABS: ORAL_TABLET | ORAL | Status: DC

## 2014-04-08 LAB — INSULIN, RANDOM: INSULIN: 9.3 u[IU]/mL (ref 2.0–19.6)

## 2014-04-08 LAB — VITAMIN D 25 HYDROXY (VIT D DEFICIENCY, FRACTURES): VIT D 25 HYDROXY: 59 ng/mL (ref 30–100)

## 2014-04-09 ENCOUNTER — Other Ambulatory Visit: Payer: Self-pay | Admitting: Internal Medicine

## 2014-04-09 DIAGNOSIS — I10 Essential (primary) hypertension: Secondary | ICD-10-CM

## 2014-04-09 MED ORDER — LOSARTAN POTASSIUM-HCTZ 100-25 MG PO TABS: ORAL_TABLET | ORAL | Status: DC

## 2014-05-02 ENCOUNTER — Encounter: Payer: Self-pay | Admitting: Internal Medicine

## 2014-07-08 ENCOUNTER — Ambulatory Visit (INDEPENDENT_AMBULATORY_CARE_PROVIDER_SITE_OTHER): Payer: 59 | Admitting: Internal Medicine

## 2014-07-08 ENCOUNTER — Encounter: Payer: Self-pay | Admitting: Internal Medicine

## 2014-07-08 VITALS — BP 144/90 | HR 68 | Temp 98.4°F | Resp 18 | Ht 72.5 in | Wt 255.0 lb

## 2014-07-08 DIAGNOSIS — R7309 Other abnormal glucose: Secondary | ICD-10-CM

## 2014-07-08 DIAGNOSIS — Z1331 Encounter for screening for depression: Secondary | ICD-10-CM

## 2014-07-08 DIAGNOSIS — Z79899 Other long term (current) drug therapy: Secondary | ICD-10-CM

## 2014-07-08 DIAGNOSIS — E785 Hyperlipidemia, unspecified: Secondary | ICD-10-CM

## 2014-07-08 DIAGNOSIS — E039 Hypothyroidism, unspecified: Secondary | ICD-10-CM

## 2014-07-08 DIAGNOSIS — E559 Vitamin D deficiency, unspecified: Secondary | ICD-10-CM

## 2014-07-08 DIAGNOSIS — E669 Obesity, unspecified: Secondary | ICD-10-CM

## 2014-07-08 DIAGNOSIS — R739 Hyperglycemia, unspecified: Secondary | ICD-10-CM

## 2014-07-08 DIAGNOSIS — I10 Essential (primary) hypertension: Secondary | ICD-10-CM

## 2014-07-08 NOTE — Progress Notes (Signed)
Patient ID: Julian Lee, male   DOB: March 23, 1955, 59 y.o.   MRN: [id number]  Assessment and Plan:  Hypertension:  -mildly elevated today.  Monitor at home.  If consistently 160/90 patient to call office -Continue medication,  -monitor blood pressure at home.  -Continue DASH diet.   -Reminder to go to the ER if any CP, SOB, nausea, dizziness, severe HA, changes vision/speech, left arm numbness and tingling, and jaw pain.  Cholesterol: -Continue diet and exercise.  -Check cholesterol.  -labs being done at outside facility per patients request.  See fax for results  Pre-diabetes: -Continue diet and exercise.  -Check A1C -see outside labs fax for results  Vitamin D Def: -check level -continue medications.   Hypothyroid -Cont Levothyroxine -tsh level  Continue diet and meds as discussed. Further disposition pending results of labs.  HPI 59 y.o. male  presents for 3 month follow up with hypertension, hyperlipidemia, prediabetes and vitamin D.   His blood pressure has been controlled at home, today their BP is BP: (!) 144/90 mmHg.   He does workout. He denies chest pain, shortness of breath, dizziness.   He is not on cholesterol medication and denies myalgias. His cholesterol is at goal. The cholesterol last visit was:   Lab Results  Component Value Date   CHOL 137 04/07/2014   HDL 48 04/07/2014   LDLCALC 75 04/07/2014   TRIG 70 04/07/2014   CHOLHDL 2.9 04/07/2014     He has been working on diet and exercise for prediabetes, and denies foot ulcerations, hyperglycemia, hypoglycemia , increased appetite, nausea, paresthesia of the feet, polydipsia, polyuria, visual disturbances, vomiting and weight loss. Last A1C in the office was:  Lab Results  Component Value Date   HGBA1C 5.5 04/07/2014    Patient is on Vitamin D supplement.  Lab Results  Component Value Date   VD25OH 35 04/07/2014      Current Medications:  Current Outpatient Prescriptions on File Prior to  Visit  Medication Sig Dispense Refill  . BABY ASPIRIN PO Take 81 mg by mouth daily.    . Calcium Carbonate Antacid (TUMS PO) Take by mouth as needed.    . Cholecalciferol (VITAMIN D PO) Take 2,000 Units by mouth 3 (three) times daily.     . Flaxseed, Linseed, (FLAX SEED OIL PO) Take by mouth daily. Takes 2 caps daily    . levothyroxine (SYNTHROID, LEVOTHROID) 150 MCG tablet Take 1 tablet M, T, Thurs and Fri, Take 1.5 tablet Wed and Sat (Patient taking differently: Take 1 tablet M, T, Thurs and Fri, Sun Take 1.5 tablet Wed and Sat) 100 tablet 3  . losartan-hydrochlorothiazide (HYZAAR) 100-25 MG per tablet Take 1 tablet daily for BP 90 tablet 3   No current facility-administered medications on file prior to visit.    Medical History:  Past Medical History  Diagnosis Date  . Hypertension   . Hyperlipidemia   . Thyroid disease   . Vitamin D deficiency     Allergies: No Known Allergies   Review of Systems:  Review of Systems  Constitutional: Negative for fever, chills and malaise/fatigue.  HENT: Negative for congestion, ear pain and sore throat.   Eyes: Negative.   Respiratory: Negative for cough, shortness of breath and wheezing.   Cardiovascular: Negative for chest pain, palpitations and leg swelling.  Gastrointestinal: Negative for heartburn, diarrhea, constipation, blood in stool and melena.  Genitourinary: Negative.   Skin: Negative.   Neurological: Negative for dizziness, sensory change and headaches.  Psychiatric/Behavioral:  Negative for depression. The patient is not nervous/anxious and does not have insomnia.     Family history- Review and unchanged  Social history- Review and unchanged  Physical Exam: BP 144/90 mmHg  Pulse 68  Temp(Src) 98.4 F (36.9 C) (Temporal)  Resp 18  Ht 6' 0.5" (1.842 m)  Wt 255 lb (115.667 kg)  BMI 34.09 kg/m2 Wt Readings from Last 3 Encounters:  07/08/14 255 lb (115.667 kg)  04/07/14 245 lb 12.8 oz (111.494 kg)  10/01/13 231 lb  (104.781 kg)    General Appearance: Well nourished well developed, in no apparent distress. Eyes: PERRLA, EOMs, conjunctiva no swelling or erythema ENT/Mouth: Ear canals normal without obstruction, swelling, erythma, discharge.  TMs normal bilaterally.  Oropharynx moist, clear, without exudate, or postoropharyngeal swelling. Neck: Supple, thyroid normal,no cervical adenopathy  Respiratory: Respiratory effort normal, Breath sounds clear A&P without rhonchi, wheeze, or rale.  No retractions, no accessory usage. Cardio: RRR with no MRGs. Brisk peripheral pulses without edema.  Abdomen: Soft, + BS,  Non tender, no guarding, rebound, hernias, masses. Musculoskeletal: Full ROM, 5/5 strength, Normal gait Skin: Warm, dry without rashes, lesions, ecchymosis.  Neuro: Awake and oriented X 3, Cranial nerves intact. Normal muscle tone, no cerebellar symptoms. Psych: Normal affect, Insight and Judgment appropriate.    FORCUCCI, Trini Soldo, PA-C 11:54 AM [facility]

## 2014-07-08 NOTE — Patient Instructions (Signed)
Ways to cut 100 calories  1. Eat your eggs with hot sauce OR salsa instead of cheese.  Eggs are great for breakfast, but many people consider eggs and cheese to be BFFs. Instead of cheese-1 oz. of cheddar has 114 calories-top your eggs with hot sauce, which contains no calories and helps with satiety and metabolism. Salsa is also a great option!!  2. Top your toast, waffles or pancakes with mashed berries instead of jelly or syrup. Half a cup of berries-fresh, frozen or thawed-has about 40 calories, compared with 2 tbsp. of maple syrup or jelly, which both have about 100 calories. The berries will also give you a good punch of fiber, which helps keep you full and satisfied and won't spike blood sugar quickly like the jelly or syrup. 3. Swap the non-fat latte for black coffee with a splash of half-and-half. Contrary to its name, that non-fat latte has 130 calories and a startling 19g of carbohydrates per 16 oz. serving. Replacing that 'light' drinkable dessert with a black coffee with a splash of half-and-half saves you more than 100 calories per 16 oz. serving. 4. Sprinkle salads with freeze-dried raspberries instead of dried cranberries. If you want a sweet addition to your nutritious salad, stay away from dried cranberries. They have a whopping 130 calories per  cup and 30g carbohydrates. Instead, sprinkle freeze-dried raspberries guilt-free and save more than 100 calories per  cup serving, adding 3g of belly-filling fiber. 5. Go for mustard in place of mayo on your sandwich. Mustard can add really nice flavor to any sandwich, and there are tons of varieties, from spicy to honey. A serving of mayo is 95 calories, versus 10 calories in a serving of mustard. 6. Choose a DIY salad dressing instead of the store-bought kind. Mix Dijon or whole grain mustard with low-fat Kefir or red wine vinegar and garlic. 7. Use hummus as a spread instead of a dip. Use hummus as a spread on a high-fiber cracker or  tortilla with a sandwich and save on calories without sacrificing taste. 8. Pick just one salad "accessory." Salad isn't automatically a calorie winner. It's easy to over-accessorize with toppings. Instead of topping your salad with nuts, avocado and cranberries (all three will clock in at 313 calories), just pick one. The next day, choose a different accessory, which will also keep your salad interesting. You don't wear all your jewelry every day, right? 9. Ditch the white pasta in favor of spaghetti squash. One cup of cooked spaghetti squash has about 40 calories, compared with traditional spaghetti, which comes with more than 200. Spaghetti squash is also nutrient-dense. It's a good source of fiber and Vitamins A and C, and it can be eaten just like you would eat pasta-with a great tomato sauce and [place] meatballs or with pesto, tofu and spinach, for example. 10. Dress up your chili, soups and stews with non-fat [place] yogurt instead of sour cream. Just a 'dollop' of sour cream can set you back 115 calories and a whopping 12g of fat-seven of which are of the artery-clogging variety. Added bonus: [place] yogurt is packed with muscle-building protein, calcium and B Vitamins. 11. Mash cauliflower instead of mashed potatoes. One cup of traditional mashed potatoes-in all their creamy goodness-has more than 200 calories, compared to mashed cauliflower, which you can typically eat for less than 100 calories per 1 cup serving. Cauliflower is a great source of the antioxidant indole-3-carbinol (I3C), which may help reduce the risk of some cancers, like breast  cancer. 12. Ditch the ice cream sundae in favor of a Greek yogurt parfait. Instead of a cup of ice cream or fro-yo for dessert, try 1 cup of nonfat Greek yogurt topped with fresh berries and a sprinkle of cacao nibs. Both toppings are packed with antioxidants, which can help reduce cellular inflammation and oxidative damage. And the comparison is a  no-brainer: One cup of ice cream has about 275 calories; one cup of frozen yogurt has about 230; and a cup of Greek yogurt has just 130, plus twice the protein, so you're less likely to return to the freezer for a second helping. 13. Put olive oil in a spray container instead of using it directly from the bottle. Each tablespoon of olive oil is 120 calories and 15g of fat. Use a mister instead of pouring it straight into the pan or onto a salad. This allows for portion control and will save you more than 100 calories. 14. When baking, substitute canned pumpkin for butter or oil. Canned pumpkin-not pumpkin pie mix-is loaded with Vitamin A, which is important for skin and eye health, as well as immunity. And the comparisons are pretty crazy:  cup of canned pumpkin has about 40 calories, compared to butter or oil, which has more than 800 calories. Yes, 800 calories. Applesauce and mashed banana can also serve as good substitutions for butter or oil, usually in a 1:1 ratio. 15. Top casseroles with high-fiber cereal instead of breadcrumbs. Breadcrumbs are typically made with white bread, while breakfast cereals contain 5-9g of fiber per serving. Not only will you save more than 150 calories per  cup serving, the swap will also keep you more full and you'll get a metabolism boost from the added fiber. 16. Snack on pistachios instead of macadamia nuts. Believe it or not, you get the same amount of calories from 35 pistachios (100 calories) as you would from only five macadamia nuts. 17. Chow down on kale chips rather than potato chips. This is my favorite 'don't knock it 'till you try it' swap. Kale chips are so easy to make at home, and you can spice them up with a little grated parmesan or chili powder. Plus, they're a mere fraction of the calories of potato chips, but with the same crunch factor we crave so often. 18. Add seltzer and some fruit slices to your cocktail instead of soda or fruit juice. One  cup of soda or fruit juice can pack on as much as 140 calories. Instead, use seltzer and fruit slices. The fruit provides valuable phytochemicals, such as flavonoids and anthocyanins, which help to combat cancer and stave off the aging process.  We want weight loss that will last so you should lose 1-2 pounds a week.  THAT IS IT! Please pick THREE things a month to change. Once it is a habit check off the item. Then pick another three items off the list to become habits.  If you are already doing a habit on the list GREAT!  Cross that item off! o Don't drink your calories. Ie, alcohol, soda, fruit juice, and sweet tea.  o Drink more water. Drink a glass when you feel hungry or before each meal.  o Eat breakfast - Complex carb and protein (likeDannon light and fit yogurt, oatmeal, fruit, eggs, turkey bacon). o Measure your cereal.  Eat no more than one cup a day. (ie Kashi) o Eat an apple a day. o Add a vegetable a day. o Try a new vegetable   a month. o Use Pam! Stop using oil or butter to cook. o Don't finish your plate or use smaller plates. o Share your dessert. o Eat sugar free Jello for dessert or frozen grapes. o Don't eat 2-3 hours before bed. o Switch to whole wheat bread, pasta, and brown rice. o Make healthier choices when you eat out. No fries! o Pick baked chicken, NOT fried. o Don't forget to SLOW DOWN when you eat. It is not going anywhere.  o Take the stairs. o Park far away in the parking lot o Lift soup cans (or weights) for 10 minutes while watching TV. o Walk at work for 10 minutes during break. o Walk outside 1 time a week with your friend, kids, dog, or significant other. o Start a walking group at church. o Walk the mall as much as you can tolerate.  o Keep a food diary. o Weigh yourself daily. o Walk for 15 minutes 3 days per week. o Cook at home more often and eat out less.  If life happens and you go back to old habits, it is okay.  Just start over. You can do  it!   If you experience chest pain, get short of breath, or tired during the exercise, please stop immediately and inform your doctor.  

## 2014-07-09 ENCOUNTER — Other Ambulatory Visit: Payer: Self-pay | Admitting: Internal Medicine

## 2014-07-10 LAB — BASIC METABOLIC PANEL
BUN / CREAT RATIO: 14 (ref 10–22)
BUN: 15 mg/dL (ref 8–27)
CHLORIDE: 99 mmol/L (ref 97–108)
CO2: 27 mmol/L (ref 18–29)
Calcium: 9.6 mg/dL (ref 8.6–10.2)
Creatinine, Ser: 1.06 mg/dL (ref 0.76–1.27)
GFR, EST AFRICAN AMERICAN: 88 mL/min/{1.73_m2} (ref >59)
GFR, EST NON AFRICAN AMERICAN: 76 mL/min/{1.73_m2} (ref >59)
GLUCOSE: 96 mg/dL (ref 65–99)
POTASSIUM: 4.5 mmol/L (ref 3.5–5.2)
Sodium: 140 mmol/L (ref 134–144)

## 2014-07-10 LAB — CBC WITH DIFFERENTIAL/PLATELET
BASOS ABS: 0.1 10*3/uL (ref 0.0–0.2)
Basos: 1 %
EOS (ABSOLUTE): 0.2 10*3/uL (ref 0.0–0.4)
Eos: 3 %
Hematocrit: 47 % (ref 37.5–51.0)
Hemoglobin: 16 g/dL (ref 12.6–17.7)
IMMATURE GRANULOCYTES: 0 %
Immature Grans (Abs): 0 10*3/uL (ref 0.0–0.1)
LYMPHS ABS: 1.2 10*3/uL (ref 0.7–3.1)
LYMPHS: 21 %
MCH: 30.4 pg (ref 26.6–33.0)
MCHC: 34 g/dL (ref 31.5–35.7)
MCV: 89 fL (ref 79–97)
Monocytes Absolute: 0.5 10*3/uL (ref 0.1–0.9)
Monocytes: 9 %
Neutrophils Absolute: 3.9 10*3/uL (ref 1.4–7.0)
Neutrophils: 66 %
Platelets: 216 10*3/uL (ref 150–379)
RBC: 5.26 x10E6/uL (ref 4.14–5.80)
RDW: 13.3 % (ref 12.3–15.4)
WBC: 5.8 10*3/uL (ref 3.4–10.8)

## 2014-07-10 LAB — HEPATIC FUNCTION PANEL
ALT: 22 IU/L (ref 0–44)
AST: 24 IU/L (ref 0–40)
Albumin: 4.3 g/dL (ref 3.6–4.8)
Alkaline Phosphatase: 70 IU/L (ref 39–117)
Bilirubin Total: 0.5 mg/dL (ref 0.0–1.2)
Bilirubin, Direct: 0.14 mg/dL (ref 0.00–0.40)
Total Protein: 6.8 g/dL (ref 6.0–8.5)

## 2014-07-10 LAB — LIPID PANEL W/O CHOL/HDL RATIO
CHOLESTEROL TOTAL: 156 mg/dL (ref 100–199)
HDL: 50 mg/dL (ref >39)
LDL CALC: 86 mg/dL (ref 0–99)
TRIGLYCERIDES: 99 mg/dL (ref 0–149)
VLDL CHOLESTEROL CAL: 20 mg/dL (ref 5–40)

## 2014-07-10 LAB — INSULIN, RANDOM: INSULIN: 14.6 u[IU]/mL (ref 2.6–24.9)

## 2014-07-10 LAB — MAGNESIUM: Magnesium: 2 mg/dL (ref 1.6–2.3)

## 2014-07-10 LAB — TSH: TSH: 1.23 u[IU]/mL (ref 0.450–4.500)

## 2014-07-10 LAB — HGB A1C W/O EAG: HEMOGLOBIN A1C: 5.5 % (ref 4.8–5.6)

## 2014-07-14 ENCOUNTER — Encounter: Payer: Self-pay | Admitting: *Deleted

## 2014-10-08 ENCOUNTER — Encounter: Payer: Self-pay | Admitting: Physician Assistant

## 2014-10-08 ENCOUNTER — Ambulatory Visit (INDEPENDENT_AMBULATORY_CARE_PROVIDER_SITE_OTHER): Payer: 59 | Admitting: Physician Assistant

## 2014-10-08 ENCOUNTER — Ambulatory Visit (HOSPITAL_COMMUNITY)
Admission: RE | Admit: 2014-10-08 | Discharge: 2014-10-08 | Disposition: A | Discharge status: Home / Self Care | Payer: 59 | Source: Ambulatory Visit | Attending: Physician Assistant | Admitting: Physician Assistant

## 2014-10-08 VITALS — BP 138/90 | HR 63 | Temp 97.7°F | Resp 16 | Ht 73.5 in | Wt 259.0 lb

## 2014-10-08 DIAGNOSIS — I10 Essential (primary) hypertension: Secondary | ICD-10-CM | POA: Diagnosis present

## 2014-10-08 DIAGNOSIS — E039 Hypothyroidism, unspecified: Secondary | ICD-10-CM

## 2014-10-08 DIAGNOSIS — E785 Hyperlipidemia, unspecified: Secondary | ICD-10-CM

## 2014-10-08 DIAGNOSIS — Z87891 Personal history of nicotine dependence: Secondary | ICD-10-CM | POA: Insufficient documentation

## 2014-10-08 DIAGNOSIS — Z79899 Other long term (current) drug therapy: Secondary | ICD-10-CM

## 2014-10-08 DIAGNOSIS — Z0001 Encounter for general adult medical examination with abnormal findings: Secondary | ICD-10-CM

## 2014-10-08 DIAGNOSIS — N400 Enlarged prostate without lower urinary tract symptoms: Secondary | ICD-10-CM

## 2014-10-08 DIAGNOSIS — J984 Other disorders of lung: Secondary | ICD-10-CM | POA: Diagnosis not present

## 2014-10-08 DIAGNOSIS — Z0000 Encounter for general adult medical examination without abnormal findings: Secondary | ICD-10-CM | POA: Diagnosis not present

## 2014-10-08 DIAGNOSIS — R7309 Other abnormal glucose: Secondary | ICD-10-CM

## 2014-10-08 DIAGNOSIS — E559 Vitamin D deficiency, unspecified: Secondary | ICD-10-CM

## 2014-10-08 NOTE — Progress Notes (Signed)
Complete Physical  Assessment and Plan: Hypertension- - continue medications, EKG, DASH diet, exercise and monitor at home. Call if greater than 140/90. ? White coat syndrome  Hyperlipidemia--continue medications, check lipids, decrease fatty foods, increase activity.   -Hypothyroidism-check TSH level, continue medications the same.   Vitamin D deficiency- cont supplement  Colonoscopy- patient declines a colonoscopy even though the risks and benefits were discussed at length. Colon cancer is 3rd most diagnosed cancer and 2nd leading cause of death in both men and women 23 years of age and older. Patient understands the risk of cancer and death with declining the test however they are willing to do cologuard screening instead. They understand that this is not as sensitive or specific as a colonoscopy and they are still recommended to get a colonoscopy. The cologuard will be sent out to their house.  Get eye exam CXR- 10 years since quit smoking  Discussed med's effects and SE's. Screening labs and tests as requested with regular follow-up as recommended. LABS DONE AT [facility]  HPI He has had elevated blood pressure for 15 + years. His blood pressure has not been checked at home but he admits to white coat syndrome, he is on losartan 100mg  daily, last visit a HCTZ 25mg  was added to the losartan, today their BP is BP: 138/90 mmHg  Lab Results  Component Value Date   GFRNONAA 76 07/09/2014   He does workout, does cardio 45 mins daily. He denies chest pain, shortness of breath, dizziness.  He is not on cholesterol medication and denies myalgias. His cholesterol is at goal. The cholesterol last visit was:   Lab Results  Component Value Date   CHOL 156 07/09/2014   HDL 50 07/09/2014   LDLCALC 86 07/09/2014   TRIG 99 07/09/2014   CHOLHDL 2.9 04/07/2014    Last A1C in the office was:  Lab Results  Component Value Date   HGBA1C 5.5 07/09/2014   Patient is on Vitamin D supplement.   Lab  Results  Component Value Date   VD25OH 32 04/07/2014     He is on thyroid medication. His medication was not changed last visit. Patient denies nervousness, palpitations and weight changes.  Lab Results  Component Value Date   TSH 1.230 07/09/2014   BMI is Body mass index is 33.7 kg/(m^2)., he is working on diet and exercise. Wt Readings from Last 3 Encounters:  10/08/14 259 lb (117.482 kg)  07/08/14 255 lb (115.667 kg)  04/07/14 245 lb 12.8 oz (111.494 kg)     Current Medications:  Current Outpatient Prescriptions on File Prior to Visit  Medication Sig Dispense Refill  . BABY ASPIRIN PO Take 81 mg by mouth daily.    . Calcium Carbonate Antacid (TUMS PO) Take by mouth as needed.    . Cholecalciferol (VITAMIN D PO) Take 2,000 Units by mouth 3 (three) times daily.     . Flaxseed, Linseed, (FLAX SEED OIL PO) Take by mouth daily. Takes 2 caps daily    . levothyroxine (SYNTHROID, LEVOTHROID) 150 MCG tablet Take 1 tablet M, T, Thurs and Fri, Take 1.5 tablet Wed and Sat (Patient taking differently: Take 1 tablet M, T, Thurs and Fri, Sun Take 1.5 tablet Wed and Sat) 100 tablet 3  . losartan-hydrochlorothiazide (HYZAAR) 100-25 MG per tablet Take 1 tablet daily for BP 90 tablet 3   No current facility-administered medications on file prior to visit.   Health Maintenance:  Immunization History  Administered Date(s) Administered  . Influenza  Split 09/26/2012  . PPD Test 04/01/2013  . Pneumococcal Polysaccharide-23 09/16/2008  . Td 08/22/2006  . Zoster 01/10/2006   Tetanus: 2008 Pneumovax: 2010 Prevnar 13: due age 51 Flu vaccine: 2015 DUE will get at work Zostavax: 2008 DEXA: CXR 2014 Colonoscopy:2005, would like to call his insurance and get the cologuard, will call back with information EGD: Cardiolite negative 2009 DEE: 6 years ago- has not had.   Allergies: No Known Allergies Medical History:  Past Medical History  Diagnosis Date  . Hypertension   . Hyperlipidemia   .  Thyroid disease   . Vitamin D deficiency    Surgical History:  Past Surgical History  Procedure Laterality Date  . Tonsillectomy and adenoidectomy     Family History:  Family History  Problem Relation Age of Onset  . Cancer Mother     lung  . Rheum arthritis Mother   . Heart attack Father    Social History:   Social History  Substance Use Topics  . Smoking status: Former Smoker    Quit date: 07/07/2004  . Smokeless tobacco: Former Systems developer    Quit date: 01/10/1990  . Alcohol Use: 0.0 oz/week    0 Standard drinks or equivalent per week     Comment: Rare   Review of Systems  Constitutional: Negative.   HENT: Negative.   Eyes: Negative.   Respiratory: Negative.   Cardiovascular: Negative.   Gastrointestinal: Negative.   Genitourinary: Negative.        Nocturia x 2, mild BPH symptoms, declines medications  Musculoskeletal: Negative.   Skin: Negative.   Neurological: Negative.   Endo/Heme/Allergies: Negative.   Psychiatric/Behavioral: Negative.      Physical Exam: Estimated body mass index is 33.7 kg/(m^2) as calculated from the following:   Height as of this encounter: 6' 1.5" (1.867 m).   Weight as of this encounter: 259 lb (117.482 kg). BP 138/90 mmHg  Pulse 63  Temp(Src) 97.7 F (36.5 C) (Temporal)  Resp 16  Ht 6' 1.5" (1.867 m)  Wt 259 lb (117.482 kg)  BMI 33.70 kg/m2  SpO2 92% General Appearance: Well nourished, in no apparent distress. Eyes: PERRLA, EOMs, conjunctiva no swelling or erythema, normal fundi and vessels. Sinuses: No Frontal/maxillary tenderness ENT/Mouth: Ext aud canals clear, normal light reflex with TMs without erythema, bulging. Good dentition. No erythema, swelling, or exudate on post pharynx. Tonsils not swollen or erythematous. Hearing normal.  Neck: Supple, thyroid normal. No bruits Respiratory: Respiratory effort normal, BS equal bilaterally without rales, rhonchi, wheezing or stridor. Cardio: RRR without murmurs, rubs or gallops.  Brisk peripheral pulses without edema.  Chest: symmetric, with normal excursions and percussion. Abdomen: Soft, +BS. Non tender, no guarding, rebound, hernias, masses, or organomegaly.  Lymphatics: Non tender without lymphadenopathy.  Genitourinary: defer Musculoskeletal: Full ROM all peripheral extremities,5/5 strength, and normal gait. Skin: Warm, dry without rashes, lesions, ecchymosis.  Neuro: Cranial nerves intact, reflexes equal bilaterally. Normal muscle tone, no cerebellar symptoms. Sensation intact.  Psych: Awake and oriented X 3, normal affect, Insight and Judgment appropriate.   EKG: WNL, IRBBB, no ST changes  Vicie Mutters 3:13 PM

## 2014-10-08 NOTE — Patient Instructions (Signed)
Preventive Care for Adults  A healthy lifestyle and preventive care can promote health and wellness. Preventive health guidelines for women include the following key practices.  A routine yearly physical is a good way to check with your health care provider about your health and preventive screening. It is a chance to share any concerns and updates on your health and to receive a thorough exam.  Visit your dentist for a routine exam and preventive care every 6 months. Brush your teeth twice a day and floss once a day. Good oral hygiene prevents tooth decay and gum disease.  The frequency of eye exams is based on your age, health, family medical history, use of contact lenses, and other factors. Follow your health care provider's recommendations for frequency of eye exams.  Eat a healthy diet. Foods like vegetables, fruits, whole grains, low-fat dairy products, and lean protein foods contain the nutrients you need without too many calories. Decrease your intake of foods high in solid fats, added sugars, and salt. Eat the right amount of calories for you.Get information about a proper diet from your health care provider, if necessary.  Regular physical exercise is one of the most important things you can do for your health. Most adults should get at least 150 minutes of moderate-intensity exercise (any activity that increases your heart rate and causes you to sweat) each week. In addition, most adults need muscle-strengthening exercises on 2 or more days a week.  Maintain a healthy weight. The body mass index (BMI) is a screening tool to identify possible weight problems. It provides an estimate of body fat based on height and weight. Your health care provider can find your BMI and can help you achieve or maintain a healthy weight.For adults 20 years and older:  A BMI below 18.5 is considered underweight.  A BMI of 18.5 to 24.9 is normal.  A BMI of 25 to 29.9 is considered overweight.  A BMI of  30 and above is considered obese.  Maintain normal blood lipids and cholesterol levels by exercising and minimizing your intake of saturated fat. Eat a balanced diet with plenty of fruit and vegetables. Blood tests for lipids and cholesterol should begin at age 20 and be repeated every 5 years. If your lipid or cholesterol levels are high, you are over 50, or you are at high risk for heart disease, you may need your cholesterol levels checked more frequently.Ongoing high lipid and cholesterol levels should be treated with medicines if diet and exercise are not working.  If you smoke, find out from your health care provider how to quit. If you do not use tobacco, do not start.  Lung cancer screening is recommended for adults aged 55-80 years who are at high risk for developing lung cancer because of a history of smoking. A yearly low-dose CT scan of the lungs is recommended for people who have at least a 30-pack-year history of smoking and are a current smoker or have quit within the past 15 years. A pack year of smoking is smoking an average of 1 pack of cigarettes a day for 1 year (for example: 1 pack a day for 30 years or 2 packs a day for 15 years). Yearly screening should continue until the smoker has stopped smoking for at least 15 years. Yearly screening should be stopped for people who develop a health problem that would prevent them from having lung cancer treatment.  High blood pressure causes heart disease and increases the risk of   stroke. Your blood pressure should be checked at least every 1 to 2 years. Ongoing high blood pressure should be treated with medicines if weight loss and exercise do not work.  If you are 55-79 years old, ask your health care provider if you should take aspirin to prevent strokes.  Diabetes screening involves taking a blood sample to check your fasting blood sugar level. This should be done once every 3 years, after age 45, if you are within normal weight and  without risk factors for diabetes. Testing should be considered at a younger age or be carried out more frequently if you are overweight and have at least 1 risk factor for diabetes.  Breast cancer screening is essential preventive care for women. You should practice "breast self-awareness." This means understanding the normal appearance and feel of your breasts and may include breast self-examination. Any changes detected, no matter how small, should be reported to a health care provider. Women in their 20s and 30s should have a clinical breast exam (CBE) by a health care provider as part of a regular health exam every 1 to 3 years. After age 40, women should have a CBE every year. Starting at age 40, women should consider having a mammogram (breast X-ray test) every year. Women who have a family history of breast cancer should talk to their health care provider about genetic screening. Women at a high risk of breast cancer should talk to their health care providers about having an MRI and a mammogram every year.  Breast cancer gene (BRCA)-related cancer risk assessment is recommended for women who have family members with BRCA-related cancers. BRCA-related cancers include breast, ovarian, tubal, and peritoneal cancers. Having family members with these cancers may be associated with an increased risk for harmful changes (mutations) in the breast cancer genes BRCA1 and BRCA2. Results of the assessment will determine the need for genetic counseling and BRCA1 and BRCA2 testing.  Routine pelvic exams to screen for cancer are no longer recommended for nonpregnant women who are considered low risk for cancer of the pelvic organs (ovaries, uterus, and vagina) and who do not have symptoms. Ask your health care provider if a screening pelvic exam is right for you.  If you have had past treatment for cervical cancer or a condition that could lead to cancer, you need Pap tests and screening for cancer for at least 20  years after your treatment. If Pap tests have been discontinued, your risk factors (such as having a new sexual partner) need to be reassessed to determine if screening should be resumed. Some women have medical problems that increase the chance of getting cervical cancer. In these cases, your health care provider may recommend more frequent screening and Pap tests.  Colorectal cancer can be detected and often prevented. Most routine colorectal cancer screening begins at the age of 50 years and continues through age 75 years. However, your health care provider may recommend screening at an earlier age if you have risk factors for colon cancer. On a yearly basis, your health care provider may provide home test kits to check for hidden blood in the stool. Use of a small camera at the end of a tube, to directly examine the colon (sigmoidoscopy or colonoscopy), can detect the earliest forms of colorectal cancer. Talk to your health care provider about this at age 50, when routine screening begins. Direct exam of the colon should be repeated every 5-10 years through age 75 years, unless early forms of pre-cancerous   polyps or small growths are found.  Hepatitis C blood testing is recommended for all people born from 1945 through 1965 and any individual with known risks for hepatitis C.  Pra  Osteoporosis is a disease in which the bones lose minerals and strength with aging. This can result in serious bone fractures or breaks. The risk of osteoporosis can be identified using a bone density scan. Women ages 65 years and over and women at risk for fractures or osteoporosis should discuss screening with their health care providers. Ask your health care provider whether you should take a calcium supplement or vitamin D to reduce the rate of osteoporosis.  Menopause can be associated with physical symptoms and risks. Hormone replacement therapy is available to decrease symptoms and risks. You should talk to your  health care provider about whether hormone replacement therapy is right for you.  Use sunscreen. Apply sunscreen liberally and repeatedly throughout the day. You should seek shade when your shadow is shorter than you. Protect yourself by wearing long sleeves, pants, a wide-brimmed hat, and sunglasses year round, whenever you are outdoors.  Once a month, do a whole body skin exam, using a mirror to look at the skin on your back. Tell your health care provider of new moles, moles that have irregular borders, moles that are larger than a pencil eraser, or moles that have changed in shape or color.  Stay current with required vaccines (immunizations).  Influenza vaccine. All adults should be immunized every year.  Tetanus, diphtheria, and acellular pertussis (Td, Tdap) vaccine. Pregnant women should receive 1 dose of Tdap vaccine during each pregnancy. The dose should be obtained regardless of the length of time since the last dose. Immunization is preferred during the 27th-36th week of gestation. An adult who has not previously received Tdap or who does not know her vaccine status should receive 1 dose of Tdap. This initial dose should be followed by tetanus and diphtheria toxoids (Td) booster doses every 10 years. Adults with an unknown or incomplete history of completing a 3-dose immunization series with Td-containing vaccines should begin or complete a primary immunization series including a Tdap dose. Adults should receive a Td booster every 10 years.  Varicella vaccine. An adult without evidence of immunity to varicella should receive 2 doses or a second dose if she has previously received 1 dose. Pregnant females who do not have evidence of immunity should receive the first dose after pregnancy. This first dose should be obtained before leaving the health care facility. The second dose should be obtained 4-8 weeks after the first dose.  Human papillomavirus (HPV) vaccine. Females aged 13-26 years  who have not received the vaccine previously should obtain the 3-dose series. The vaccine is not recommended for use in pregnant females. However, pregnancy testing is not needed before receiving a dose. If a male is found to be pregnant after receiving a dose, no treatment is needed. In that case, the remaining doses should be delayed until after the pregnancy. Immunization is recommended for any person with an immunocompromised condition through the age of 26 years if she did not get any or all doses earlier. During the 3-dose series, the second dose should be obtained 4-8 weeks after the first dose. The third dose should be obtained 24 weeks after the first dose and 16 weeks after the second dose.  Zoster vaccine. One dose is recommended for adults aged 60 years or older unless certain conditions are present.  Measles, mumps, and rubella (  MMR) vaccine. Adults born before [birth year] generally are considered immune to measles and mumps. Adults born in [birth year] or later should have 1 or more doses of MMR vaccine unless there is a contraindication to the vaccine or there is laboratory evidence of immunity to each of the three diseases. A routine second dose of MMR vaccine should be obtained at least 28 days after the first dose for students attending postsecondary schools, health care workers, or international travelers. People who received inactivated measles vaccine or an unknown type of measles vaccine during 1963-1967 should receive 2 doses of MMR vaccine. People who received inactivated mumps vaccine or an unknown type of mumps vaccine before 1979 and are at high risk for mumps infection should consider immunization with 2 doses of MMR vaccine. For females of childbearing age, rubella immunity should be determined. If there is no evidence of immunity, females who are not pregnant should be vaccinated. If there is no evidence of immunity, females who are pregnant should delay immunization until after pregnancy.  Unvaccinated health care workers born before [birth year] who lack laboratory evidence of measles, mumps, or rubella immunity or laboratory confirmation of disease should consider measles and mumps immunization with 2 doses of MMR vaccine or rubella immunization with 1 dose of MMR vaccine.  Pneumococcal 13-valent conjugate (PCV13) vaccine. When indicated, a person who is uncertain of her immunization history and has no record of immunization should receive the PCV13 vaccine. An adult aged 39 years or older who has certain medical conditions and has not been previously immunized should receive 1 dose of PCV13 vaccine. This PCV13 should be followed with a dose of pneumococcal polysaccharide (PPSV23) vaccine. The PPSV23 vaccine dose should be obtained at least 8 weeks after the dose of PCV13 vaccine. An adult aged 62 years or older who has certain medical conditions and previously received 1 or more doses of PPSV23 vaccine should receive 1 dose of PCV13. The PCV13 vaccine dose should be obtained 1 or more years after the last PPSV23 vaccine dose.    Pneumococcal polysaccharide (PPSV23) vaccine. When PCV13 is also indicated, PCV13 should be obtained first. All adults aged 67 years and older should be immunized. An adult younger than age 45 years who has certain medical conditions should be immunized. Any person who resides in a nursing home or long-term care facility should be immunized. An adult smoker should be immunized. People with an immunocompromised condition and certain other conditions should receive both PCV13 and PPSV23 vaccines. People with human immunodeficiency virus (HIV) infection should be immunized as soon as possible after diagnosis. Immunization during chemotherapy or radiation therapy should be avoided. Routine use of PPSV23 vaccine is not recommended for American Indians, [place] Natives, or people younger than 65 years unless there are medical conditions that require PPSV23 vaccine. When indicated,  people who have unknown immunization and have no record of immunization should receive PPSV23 vaccine. One-time revaccination 5 years after the first dose of PPSV23 is recommended for people aged 19-64 years who have chronic kidney failure, nephrotic syndrome, asplenia, or immunocompromised conditions. People who received 1-2 doses of PPSV23 before age 23 years should receive another dose of PPSV23 vaccine at age 35 years or later if at least 5 years have passed since the previous dose. Doses of PPSV23 are not needed for people immunized with PPSV23 at or after age 38 years.  Preventive Services / Frequency   Ages 43 to 86 years  Blood pressure check.  Lipid and cholesterol check.  Lung  cancer screening. / Every year if you are aged 74-80 years and have a 30-pack-year history of smoking and currently smoke or have quit within the past 15 years. Yearly screening is stopped once you have quit smoking for at least 15 years or develop a health problem that would prevent you from having lung cancer treatment.  Clinical breast exam.** / Every year after age 45 years.  BRCA-related cancer risk assessment.** / For women who have family members with a BRCA-related cancer (breast, ovarian, tubal, or peritoneal cancers).  Mammogram.** / Every year beginning at age 23 years and continuing for as long as you are in good health. Consult with your health care provider.  Pap test.** / Every 3 years starting at age 67 years through age 77 or 45 years with a history of 3 consecutive normal Pap tests.  HPV screening.** / Every 3 years from ages 71 years through ages 48 to 36 years with a history of 3 consecutive normal Pap tests.  Fecal occult blood test (FOBT) of stool. / Every year beginning at age 70 years and continuing until age 66 years. You may not need to do this test if you get a colonoscopy every 10 years.  Flexible sigmoidoscopy or colonoscopy.** / Every 5 years for a flexible sigmoidoscopy or  every 10 years for a colonoscopy beginning at age 63 years and continuing until age 21 years.  Hepatitis C blood test.** / For all people born from [birth year] through 1965 and any individual with known risks for hepatitis C.  Skin self-exam. / Monthly.  Influenza vaccine. / Every year.  Tetanus, diphtheria, and acellular pertussis (Tdap/Td) vaccine.** / Consult your health care provider. Pregnant women should receive 1 dose of Tdap vaccine during each pregnancy. 1 dose of Td every 10 years.  Varicella vaccine.** / Consult your health care provider. Pregnant females who do not have evidence of immunity should receive the first dose after pregnancy.  Zoster vaccine.** / 1 dose for adults aged 25 years or older.  Pneumococcal 13-valent conjugate (PCV13) vaccine.** / Consult your health care provider.  Pneumococcal polysaccharide (PPSV23) vaccine.** / 1 to 2 doses if you smoke cigarettes or if you have certain conditions.  Meningococcal vaccine.** / Consult your health care provider.  Hepatitis A vaccine.** / Consult your health care provider.  Hepatitis B vaccine.** / Consult your health care provider. Screening for abdominal aortic aneurysm (AAA)  by ultrasound is recommended for people over 50 who have history of high blood pressure or who are current or former smokers.

## 2014-10-09 ENCOUNTER — Other Ambulatory Visit: Payer: Self-pay | Admitting: Physician Assistant

## 2014-10-10 LAB — CBC WITH DIFFERENTIAL/PLATELET
BASOS ABS: 0.1 10*3/uL (ref 0.0–0.2)
Basos: 1 %
EOS (ABSOLUTE): 0.4 10*3/uL (ref 0.0–0.4)
Eos: 6 %
Hematocrit: 48.6 % (ref 37.5–51.0)
Hemoglobin: 16.8 g/dL (ref 12.6–17.7)
IMMATURE GRANS (ABS): 0 10*3/uL (ref 0.0–0.1)
Immature Granulocytes: 0 %
LYMPHS ABS: 1.1 10*3/uL (ref 0.7–3.1)
LYMPHS: 20 %
MCH: 31.2 pg (ref 26.6–33.0)
MCHC: 34.6 g/dL (ref 31.5–35.7)
MCV: 90 fL (ref 79–97)
Monocytes Absolute: 0.5 10*3/uL (ref 0.1–0.9)
Monocytes: 9 %
NEUTROS ABS: 3.5 10*3/uL (ref 1.4–7.0)
Neutrophils: 64 %
PLATELETS: 224 10*3/uL (ref 150–379)
RBC: 5.39 x10E6/uL (ref 4.14–5.80)
RDW: 13.3 % (ref 12.3–15.4)
WBC: 5.5 10*3/uL (ref 3.4–10.8)

## 2014-10-10 LAB — BMP8+EGFR
BUN/Creatinine Ratio: 16 (ref 10–22)
BUN: 18 mg/dL (ref 8–27)
CALCIUM: 9.9 mg/dL (ref 8.6–10.2)
CHLORIDE: 99 mmol/L (ref 97–108)
CO2: 27 mmol/L (ref 18–29)
Creatinine, Ser: 1.11 mg/dL (ref 0.76–1.27)
GFR calc non Af Amer: 71 mL/min/{1.73_m2} (ref >59)
GFR, EST AFRICAN AMERICAN: 82 mL/min/{1.73_m2} (ref >59)
GLUCOSE: 88 mg/dL (ref 65–99)
POTASSIUM: 4.4 mmol/L (ref 3.5–5.2)
Sodium: 139 mmol/L (ref 134–144)

## 2014-10-10 LAB — HEPATIC FUNCTION PANEL
ALT: 21 IU/L (ref 0–44)
AST: 25 IU/L (ref 0–40)
Albumin: 4.6 g/dL (ref 3.6–4.8)
Alkaline Phosphatase: 75 IU/L (ref 39–117)
BILIRUBIN TOTAL: 0.5 mg/dL (ref 0.0–1.2)
BILIRUBIN, DIRECT: 0.14 mg/dL (ref 0.00–0.40)
Total Protein: 7 g/dL (ref 6.0–8.5)

## 2014-10-10 LAB — HGB A1C W/O EAG: HEMOGLOBIN A1C: 5.6 % (ref 4.8–5.6)

## 2014-10-10 LAB — MICROALBUMIN / CREATININE URINE RATIO
Creatinine, Urine: 41.8 mg/dL
MICROALB/CREAT RATIO: 33.3 mg/g{creat} — AB (ref 0.0–30.0)
Microalbumin, Urine: 13.9 ug/mL

## 2014-10-10 LAB — VITAMIN D 25 HYDROXY (VIT D DEFICIENCY, FRACTURES): VIT D 25 HYDROXY: 61.8 ng/mL (ref 30.0–100.0)

## 2014-10-10 LAB — LIPID PANEL W/O CHOL/HDL RATIO
CHOLESTEROL TOTAL: 154 mg/dL (ref 100–199)
HDL: 52 mg/dL (ref >39)
LDL Calculated: 86 mg/dL (ref 0–99)
TRIGLYCERIDES: 78 mg/dL (ref 0–149)
VLDL Cholesterol Cal: 16 mg/dL (ref 5–40)

## 2014-10-10 LAB — MAGNESIUM: Magnesium: 2 mg/dL (ref 1.6–2.3)

## 2014-10-10 LAB — PSA: Prostate Specific Ag, Serum: 0.7 ng/mL (ref 0.0–4.0)

## 2014-10-10 LAB — TSH: TSH: 0.91 u[IU]/mL (ref 0.450–4.500)

## 2014-10-23 ENCOUNTER — Other Ambulatory Visit: Payer: Self-pay | Admitting: Internal Medicine

## 2015-04-14 ENCOUNTER — Ambulatory Visit (INDEPENDENT_AMBULATORY_CARE_PROVIDER_SITE_OTHER): Payer: 59 | Admitting: Physician Assistant

## 2015-04-14 ENCOUNTER — Encounter: Payer: Self-pay | Admitting: Physician Assistant

## 2015-04-14 VITALS — BP 146/98 | HR 70 | Temp 98.0°F | Resp 18 | Ht 73.5 in | Wt 266.0 lb

## 2015-04-14 DIAGNOSIS — I10 Essential (primary) hypertension: Secondary | ICD-10-CM | POA: Diagnosis not present

## 2015-04-14 DIAGNOSIS — Z79899 Other long term (current) drug therapy: Secondary | ICD-10-CM | POA: Diagnosis not present

## 2015-04-14 DIAGNOSIS — E039 Hypothyroidism, unspecified: Secondary | ICD-10-CM | POA: Diagnosis not present

## 2015-04-14 DIAGNOSIS — R7309 Other abnormal glucose: Secondary | ICD-10-CM

## 2015-04-14 DIAGNOSIS — E785 Hyperlipidemia, unspecified: Secondary | ICD-10-CM | POA: Diagnosis not present

## 2015-04-14 DIAGNOSIS — E559 Vitamin D deficiency, unspecified: Secondary | ICD-10-CM

## 2015-04-14 NOTE — Progress Notes (Signed)
Patient ID: Julian Lee, male   DOB: [birth year], 60 y.o.   MRN: [id number]  Assessment and Plan:  Hypertension:  -mildly elevated today.  Monitor at home.  If consistently 160/90 patient to call office -Continue medication,  -monitor blood pressure at home.  -Continue DASH diet.   -Reminder to go to the ER if any CP, SOB, nausea, dizziness, severe HA, changes vision/speech, left arm numbness and tingling, and jaw pain.  Cholesterol: -Continue diet and exercise.  -Check cholesterol.  -labs being done at outside facility per patients request.  See fax for results  Pre-diabetes: -Continue diet and exercise.  -Check A1C -see outside labs fax for results  Vitamin D Def: -check level -continue medications.   Hypothyroid -Cont Levothyroxine -tsh level  Continue diet and meds as discussed. Further disposition pending results of labs. LABS DONE AT [facility] No future appointments.  HPI 60 y.o. male  presents for 3 month follow up with hypertension, hyperlipidemia, prediabetes and vitamin D.   His blood pressure has been controlled at home, 135-140 highest at home, today their BP is BP: (!) 146/98 mmHg.   He does workout. He denies chest pain, shortness of breath, dizziness. He is biking and doing 2 training sessions a week.    He is not on cholesterol medication and denies myalgias. His cholesterol is at goal. The cholesterol last visit was:   Lab Results  Component Value Date   CHOL 154 10/09/2014   HDL 52 10/09/2014   LDLCALC 86 10/09/2014   TRIG 78 10/09/2014   CHOLHDL 2.9 04/07/2014     He has been working on diet and exercise for prediabetes, and denies foot ulcerations, hyperglycemia, hypoglycemia , increased appetite, nausea, paresthesia of the feet, polydipsia, polyuria, visual disturbances, vomiting and weight loss. Last A1C in the office was:  Lab Results  Component Value Date   HGBA1C 5.6 10/09/2014    Patient is on Vitamin D supplement.  Lab Results   Component Value Date   VD25OH 61.8 10/09/2014     BMI is Body mass index is 34.61 kg/(m^2)., he is working on diet and exercise. Wt Readings from Last 3 Encounters:  04/14/15 266 lb (120.657 kg)  10/08/14 259 lb (117.482 kg)  07/08/14 255 lb (115.667 kg)   He is on thyroid medication. His medication was not changed last visit.   Lab Results  Component Value Date   TSH 0.910 10/09/2014  .   Current Medications:  Current Outpatient Prescriptions on File Prior to Visit  Medication Sig Dispense Refill  . BABY ASPIRIN PO Take 81 mg by mouth daily.    . Calcium Carbonate Antacid (TUMS PO) Take by mouth as needed.    . Cholecalciferol (VITAMIN D PO) Take 2,000 Units by mouth 3 (three) times daily.     . Flaxseed, Linseed, (FLAX SEED OIL PO) Take by mouth daily. Takes 2 caps daily    . levothyroxine (SYNTHROID, LEVOTHROID) 150 MCG tablet Take 1 tablet by mouth  Monday, Tuesday, Thursday  and Friday and Take 1 and  1/2 tablets by mouth  Wednesday and Saturday 90 tablet 2  . losartan-hydrochlorothiazide (HYZAAR) 100-25 MG per tablet Take 1 tablet daily for BP 90 tablet 3   No current facility-administered medications on file prior to visit.    Medical History:  Past Medical History  Diagnosis Date  . Hypertension   . Hyperlipidemia   . Thyroid disease   . Vitamin D deficiency     Allergies: No Known  Allergies   Review of Systems:  Review of Systems  Constitutional: Negative for fever, chills and malaise/fatigue.  HENT: Negative for congestion, ear pain and sore throat.   Eyes: Negative.   Respiratory: Negative for cough, shortness of breath and wheezing.   Cardiovascular: Negative for chest pain, palpitations and leg swelling.  Gastrointestinal: Negative for heartburn, diarrhea, constipation, blood in stool and melena.  Genitourinary: Negative.   Skin: Negative.   Neurological: Negative for dizziness, sensory change and headaches.  Psychiatric/Behavioral: Negative for  depression. The patient is not nervous/anxious and does not have insomnia.     Family history- Review and unchanged  Social history- Review and unchanged  Physical Exam: BP 146/98 mmHg  Pulse 70  Temp(Src) 98 F (36.7 C) (Temporal)  Resp 18  Ht 6' 1.5" (1.867 m)  Wt 266 lb (120.657 kg)  BMI 34.61 kg/m2 Wt Readings from Last 3 Encounters:  04/14/15 266 lb (120.657 kg)  10/08/14 259 lb (117.482 kg)  07/08/14 255 lb (115.667 kg)    General Appearance: Well nourished well developed, in no apparent distress. Eyes: PERRLA, EOMs, conjunctiva no swelling or erythema ENT/Mouth: Ear canals normal without obstruction, swelling, erythma, discharge.  TMs normal bilaterally.  Oropharynx moist, clear, without exudate, or postoropharyngeal swelling. Neck: Supple, thyroid normal,no cervical adenopathy  Respiratory: Respiratory effort normal, Breath sounds clear A&P without rhonchi, wheeze, or rale.  No retractions, no accessory usage. Cardio: RRR with no MRGs. Brisk peripheral pulses without edema.  Abdomen: Soft, + BS,  Non tender, no guarding, rebound, hernias, masses. Musculoskeletal: Full ROM, 5/5 strength, Normal gait Skin: Warm, dry without rashes, lesions, ecchymosis.  Neuro: Awake and oriented X 3, Cranial nerves intact. Normal muscle tone, no cerebellar symptoms. Psych: Normal affect, Insight and Judgment appropriate.    Vicie Mutters, PA-C 2:51 PM [facility]

## 2015-04-14 NOTE — Patient Instructions (Signed)

## 2015-04-15 ENCOUNTER — Other Ambulatory Visit: Payer: Self-pay | Admitting: Physician Assistant

## 2015-04-16 LAB — BMP8+EGFR
BUN/Creatinine Ratio: 14 (ref 10–24)
BUN: 15 mg/dL (ref 8–27)
CALCIUM: 9.9 mg/dL (ref 8.6–10.2)
CO2: 23 mmol/L (ref 18–29)
Chloride: 99 mmol/L (ref 96–106)
Creatinine, Ser: 1.11 mg/dL (ref 0.76–1.27)
GFR calc Af Amer: 82 mL/min/{1.73_m2} (ref >59)
GFR calc non Af Amer: 71 mL/min/{1.73_m2} (ref >59)
GLUCOSE: 90 mg/dL (ref 65–99)
Potassium: 4.8 mmol/L (ref 3.5–5.2)
Sodium: 141 mmol/L (ref 134–144)

## 2015-04-16 LAB — CBC WITH DIFFERENTIAL/PLATELET
BASOS: 1 %
Basophils Absolute: 0.1 10*3/uL (ref 0.0–0.2)
EOS (ABSOLUTE): 0.3 10*3/uL (ref 0.0–0.4)
EOS: 6 %
HEMATOCRIT: 49 % (ref 37.5–51.0)
HEMOGLOBIN: 16.5 g/dL (ref 12.6–17.7)
Immature Grans (Abs): 0 10*3/uL (ref 0.0–0.1)
Immature Granulocytes: 0 %
LYMPHS ABS: 1 10*3/uL (ref 0.7–3.1)
Lymphs: 20 %
MCH: 30.3 pg (ref 26.6–33.0)
MCHC: 33.7 g/dL (ref 31.5–35.7)
MCV: 90 fL (ref 79–97)
MONOCYTES: 10 %
Monocytes Absolute: 0.5 10*3/uL (ref 0.1–0.9)
Neutrophils Absolute: 3.1 10*3/uL (ref 1.4–7.0)
Neutrophils: 63 %
Platelets: 225 10*3/uL (ref 150–379)
RBC: 5.44 x10E6/uL (ref 4.14–5.80)
RDW: 13.5 % (ref 12.3–15.4)
WBC: 4.9 10*3/uL (ref 3.4–10.8)

## 2015-04-16 LAB — HEPATIC FUNCTION PANEL
ALT: 29 IU/L (ref 0–44)
AST: 27 IU/L (ref 0–40)
Albumin: 4.5 g/dL (ref 3.6–4.8)
Alkaline Phosphatase: 82 IU/L (ref 39–117)
BILIRUBIN, DIRECT: 0.14 mg/dL (ref 0.00–0.40)
Bilirubin Total: 0.6 mg/dL (ref 0.0–1.2)
TOTAL PROTEIN: 7.2 g/dL (ref 6.0–8.5)

## 2015-04-16 LAB — LIPID PANEL W/O CHOL/HDL RATIO
Cholesterol, Total: 166 mg/dL (ref 100–199)
HDL: 52 mg/dL (ref >39)
LDL CALC: 91 mg/dL (ref 0–99)
Triglycerides: 117 mg/dL (ref 0–149)
VLDL Cholesterol Cal: 23 mg/dL (ref 5–40)

## 2015-04-16 LAB — TSH: TSH: 0.513 u[IU]/mL (ref 0.450–4.500)

## 2015-04-16 LAB — HGB A1C W/O EAG: HEMOGLOBIN A1C: 5.3 % (ref 4.8–5.6)

## 2015-04-16 LAB — VITAMIN D 25 HYDROXY (VIT D DEFICIENCY, FRACTURES): Vit D, 25-Hydroxy: 62.6 ng/mL (ref 30.0–100.0)

## 2015-04-16 LAB — MAGNESIUM: MAGNESIUM: 2.1 mg/dL (ref 1.6–2.3)

## 2015-04-25 ENCOUNTER — Other Ambulatory Visit: Payer: Self-pay | Admitting: Internal Medicine

## 2015-07-15 ENCOUNTER — Other Ambulatory Visit: Payer: Self-pay | Admitting: Internal Medicine

## 2015-07-21 ENCOUNTER — Other Ambulatory Visit: Payer: Self-pay | Admitting: Internal Medicine

## 2015-10-07 ENCOUNTER — Other Ambulatory Visit: Payer: Self-pay

## 2015-10-07 DIAGNOSIS — E039 Hypothyroidism, unspecified: Secondary | ICD-10-CM

## 2015-10-07 MED ORDER — LEVOTHYROXINE SODIUM 150 MCG PO TABS: ORAL_TABLET | ORAL | 0 refills | Status: DC

## 2015-10-09 ENCOUNTER — Other Ambulatory Visit: Payer: Self-pay | Admitting: Internal Medicine

## 2015-10-09 DIAGNOSIS — E039 Hypothyroidism, unspecified: Secondary | ICD-10-CM

## 2015-10-14 ENCOUNTER — Ambulatory Visit (INDEPENDENT_AMBULATORY_CARE_PROVIDER_SITE_OTHER): Payer: 59 | Admitting: Physician Assistant

## 2015-10-14 ENCOUNTER — Encounter: Payer: Self-pay | Admitting: Physician Assistant

## 2015-10-14 VITALS — BP 140/78 | HR 72 | Temp 98.1°F | Resp 16 | Ht 73.5 in | Wt 279.6 lb

## 2015-10-14 DIAGNOSIS — E559 Vitamin D deficiency, unspecified: Secondary | ICD-10-CM

## 2015-10-14 DIAGNOSIS — E785 Hyperlipidemia, unspecified: Secondary | ICD-10-CM

## 2015-10-14 DIAGNOSIS — Z136 Encounter for screening for cardiovascular disorders: Secondary | ICD-10-CM | POA: Diagnosis not present

## 2015-10-14 DIAGNOSIS — N400 Enlarged prostate without lower urinary tract symptoms: Secondary | ICD-10-CM

## 2015-10-14 DIAGNOSIS — Z0001 Encounter for general adult medical examination with abnormal findings: Secondary | ICD-10-CM

## 2015-10-14 DIAGNOSIS — Z0000 Encounter for general adult medical examination without abnormal findings: Secondary | ICD-10-CM

## 2015-10-14 DIAGNOSIS — Z79899 Other long term (current) drug therapy: Secondary | ICD-10-CM

## 2015-10-14 DIAGNOSIS — I10 Essential (primary) hypertension: Secondary | ICD-10-CM

## 2015-10-14 DIAGNOSIS — R7309 Other abnormal glucose: Secondary | ICD-10-CM

## 2015-10-14 DIAGNOSIS — E039 Hypothyroidism, unspecified: Secondary | ICD-10-CM

## 2015-10-14 DIAGNOSIS — Z1159 Encounter for screening for other viral diseases: Secondary | ICD-10-CM

## 2015-10-14 DIAGNOSIS — Z1212 Encounter for screening for malignant neoplasm of rectum: Secondary | ICD-10-CM

## 2015-10-14 NOTE — Patient Instructions (Addendum)
Cologuard is an easy to use noninvasive colon cancer screening test based on the latest advances in stool DNA science.   Colon cancer is 3rd most diagnosed cancer and 2nd leading cause of death in both men and women 62 years of age and older despite being one of the most preventable and treatable cancers if found early.  4 of out 5 people diagnosed with colon cancer have NO prior family history.  When caught EARLY 90% of colon cancer is curable.   You have agreed to do a Cologuard screening and have declined a colonoscopy in spite of being explained the risks and benefits of the colonoscopy in detail, including cancer and death. Please understand that this is test not as sensitive or specific as a colonoscopy and you are still recommended to get a colonoscopy.   If you are NOT medicare please call your insurance company and given them this CPT code, [phone], in order to see how much your insurance company will cover Or you can call [phone] to talk with Cologuard about pricing and coverage.  Out-of-pocket cost for Cologuard can range from $0 - $649 so please call  You will receive a short call from [place] support center at [employer], when you receive a call they will say they are from [place],  to confirm your mailing address and give you more information.  When they calll you, it will appear on the caller ID as "[employer]" or in some cases only this number will appear, [phone].   [facility] [employer] will ship your collection kit directly to you. You will collect a single stool sample in the privacy of your own home, no special preparation required. You will return the kit via [facility] pre-paid shipping or pick-up, in the same box it arrived in. Then I will contact you to discuss your results after I receive them from the laboratory.   If you have any questions or concerns, Cologuard Customer Support Specialist are available 24 hours a day, 7 days  a week at [phone] or go to [email or web address].      We want weight loss that will last so you should lose 1-2 pounds a week.  THAT IS IT! Please pick THREE things a month to change. Once it is a habit check off the item. Then pick another three items off the list to become habits.  If you are already doing a habit on the list GREAT!  Cross that item off! o Don't drink your calories. Ie, alcohol, soda, fruit juice, and sweet tea.  o Drink more water. Drink a glass when you feel hungry or before each meal.  o Eat breakfast - Complex carb and protein (likeDannon light and fit yogurt, oatmeal, fruit, eggs, [place] bacon). o Measure your cereal.  Eat no more than one cup a day. (ie [place]) o Eat an apple a day. o Add a vegetable a day. o Try a new vegetable a month. o Use Pam! Stop using oil or butter to cook. o Don't finish your plate or use smaller plates. o Share your dessert. o Eat sugar free Jello for dessert or frozen grapes. o Don't eat 2-3 hours before bed. o Switch to whole wheat bread, pasta, and brown rice. o Make healthier choices when you eat out. No fries! o Pick baked chicken, NOT fried. o Don't forget to SLOW DOWN when you eat. It is not going anywhere.  o Take the stairs. o Park far away in the parking  lot o Lift soup cans (or weights) for 10 minutes while watching TV. o Walk at work for 10 minutes during break. o Walk outside 1 time a week with your friend, kids, dog, or significant other. o Start a walking group at [facility] as much as you can tolerate.  o Keep a food diary. o Weigh yourself daily. o Walk for 15 minutes 3 days per week. o Cook at home more often and eat out less.  If life happens and you go back to old habits, it is okay.  Just start over. You can do it!   If you experience chest pain, get short of breath, or tired during the exercise, please stop immediately and inform your doctor.      Bad carbs also include fruit juice,  alcohol, and sweet tea. These are empty calories that do not signal to your brain that you are full.   Please remember the good carbs are still carbs which convert into sugar. So please measure them out no more than 1/2-1 cup of rice, oatmeal, pasta, and beans  Veggies are however free foods! Pile them on.   Not all fruit is created equal. Please see the list below, the fruit at the bottom is higher in sugars than the fruit at the top. Please avoid all dried fruits.

## 2015-10-14 NOTE — Progress Notes (Signed)
Complete Physical  Assessment and Plan: 1. Essential hypertension - continue medications, DASH diet, exercise and monitor at home. Call if greater than 130/80.  - MONITOR AT HOME, MAY NEED TO ADD ALPHA BLOCKER AT NIGHT - EKG 12-Lead  2. Hypothyroidism, unspecified type Hypothyroidism-check TSH level, continue medications the same, reminded to take on an empty stomach 30-46mins before food.   3. Hyperlipidemia, unspecified hyperlipidemia type -continue medications, check lipids, decrease fatty foods, increase activity.   4. Morbid obesity  - long discussion about weight loss, diet, and exercise  5. Abnormal glucose Will check next year  6. Medication management  7. Vitamin D deficiency  8. BPH without obstruction/lower urinary tract symptoms Declines medications at this time, check PSA  9. Screening for viral disease Check HIV/HepC  10. Screening for rectal cancer Will call again about colo guard next year, had poor experience with colonosocpy prep and prefers not to do it.  - POC Hemoccult Bld/Stl (3-Cd Home Screen); Future  11. Encounter for general adult medical examination with abnormal findings - EKG 12-Lead - POC Hemoccult Bld/Stl (3-Cd Home Screen); Future   Discussed med's effects and SE's. Screening labs and tests as requested with regular follow-up as recommended. [facility]S DONE AT [facility]  HPI He has had elevated blood pressure for 15 + years. His blood pressure has not been checked at home but he admits to white coat syndrome, he is on losartan 100mg  daily, last visit a HCTZ 25mg  was added to the losartan, today their BP is BP: 140/78  Lab Results  Component Value Date   GFRNONAA 71 04/15/2015   He does workout, does cardio 30 mins 5 x a week.  He denies chest pain, shortness of breath, dizziness.  He is not on cholesterol medication and denies myalgias. His cholesterol is at goal. The cholesterol last visit was:   Lab Results  Component Value Date   CHOL 166  04/15/2015   HDL 52 04/15/2015   LDLCALC 91 04/15/2015   TRIG 117 04/15/2015   CHOLHDL 2.9 04/07/2014    Last A1C in the office was:  Lab Results  Component Value Date   HGBA1C 5.3 04/15/2015   Patient is on Vitamin D supplement.   Lab Results  Component Value Date   VD25OH 62.6 04/15/2015     He is on thyroid medication. His medication was not changed last visit. Patient denies nervousness, palpitations and weight changes.  Lab Results  Component Value Date   TSH 0.513 04/15/2015   BMI is Body mass index is 36.39 kg/m., he is working on diet and exercise. Wt Readings from Last 3 Encounters:  10/14/15 279 lb 9.6 oz (126.8 kg)  04/14/15 266 lb (120.7 kg)  10/08/14 259 lb (117.5 kg)     Current Medications:  Current Outpatient Prescriptions on File Prior to Visit  Medication Sig Dispense Refill  . BABY ASPIRIN PO Take 81 mg by mouth daily.    . Calcium Carbonate Antacid (TUMS PO) Take by mouth as needed.    . Cholecalciferol (VITAMIN D PO) Take 2,000 Units by mouth 3 (three) times daily.     . Flaxseed, Linseed, (FLAX SEED OIL PO) Take by mouth daily. Takes 2 caps daily    . levothyroxine (SYNTHROID, LEVOTHROID) 150 MCG tablet TAKE 1 TABLET BY MOUTH  MONDAY, TUESDAY, THURSDAY  AND FRIDAY AND TAKE 1 AND  1/2 TABLETS WEDNESDAY AND  SATURDAY 90 tablet 0  . losartan-hydrochlorothiazide (HYZAAR) 100-25 MG tablet Take 1 tablet by mouth  daily for blood pressure 90 tablet 0   No current facility-administered medications on file prior to visit.    Health Maintenance:  Immunization History  Administered Date(s) Administered  . Influenza Split 09/26/2012  . PPD Test 04/01/2013  . Pneumococcal Polysaccharide-23 09/16/2008  . Td 08/22/2006  . Zoster 01/10/2006   Tetanus: 2008 Pneumovax: 2010 Prevnar 13: due age 20 Flu vaccine: 2017 at work Zostavax: 2008  DEXA: CXR 2014 Colonoscopy:2005, has called insurance and cologuard not covered at this time, will call back with  information- will do hemoccult card EGD: Cardiolite negative 2009 DEE: 6 years ago- has not had.   Allergies: No Known Allergies Medical History:  Past Medical History:  Diagnosis Date  . Hyperlipidemia   . Hypertension   . Thyroid disease   . Vitamin D deficiency    Allergies No Known Allergies  SURGICAL HISTORY He  has a past surgical history that includes Tonsillectomy and adenoidectomy. FAMILY HISTORY His family history includes Cancer in his mother; Heart attack in his father; Rheum arthritis in his mother. SOCIAL HISTORY He  reports that he quit smoking about 11 years ago. He quit smokeless tobacco use about 25 years ago. He reports that he drinks alcohol. He reports that he does not use drugs.   Review of Systems  Constitutional: Negative.   HENT: Negative.   Eyes: Negative.   Respiratory: Negative.   Cardiovascular: Negative.   Gastrointestinal: Negative.   Genitourinary: Negative.        Nocturia x 2, mild BPH symptoms, declines medications  Musculoskeletal: Negative.   Skin: Negative.   Neurological: Negative.   Endo/Heme/Allergies: Negative.   Psychiatric/Behavioral: Negative.      Physical Exam: Estimated body mass index is 36.39 kg/m as calculated from the following:   Height as of this encounter: 6' 1.5" (1.867 m).   Weight as of this encounter: 279 lb 9.6 oz (126.8 kg). BP 140/78   Pulse 72   Temp 98.1 F (36.7 C)   Resp 16   Ht 6' 1.5" (1.867 m)   Wt 279 lb 9.6 oz (126.8 kg)   SpO2 98%   BMI 36.39 kg/m  General Appearance: Well nourished, in no apparent distress. Eyes: PERRLA, EOMs, conjunctiva no swelling or erythema, normal fundi and vessels. Sinuses: No Frontal/maxillary tenderness ENT/Mouth: Ext aud canals clear, normal light reflex with TMs without erythema, bulging. Good dentition. No erythema, swelling, or exudate on post pharynx. Tonsils not swollen or erythematous. Hearing normal.  Neck: Supple, thyroid normal. No  bruits Respiratory: Respiratory effort normal, BS equal bilaterally without rales, rhonchi, wheezing or stridor. Cardio: RRR without murmurs, rubs or gallops. Brisk peripheral pulses without edema.  Chest: symmetric, with normal excursions and percussion. Abdomen: Soft, +BS. Non tender, no guarding, rebound, hernias, masses, or organomegaly.  Lymphatics: Non tender without lymphadenopathy.  Genitourinary: defer Musculoskeletal: Full ROM all peripheral extremities,5/5 strength, and normal gait. Skin: Warm, dry without rashes, lesions, ecchymosis.  Neuro: Cranial nerves intact, reflexes equal bilaterally. Normal muscle tone, no cerebellar symptoms. Sensation intact.  Psych: Awake and oriented X 3, normal affect, Insight and Judgment appropriate.   EKG: WNL, IRBBB, no ST changes  Vicie Mutters 2:05 PM

## 2015-10-15 ENCOUNTER — Other Ambulatory Visit: Payer: Self-pay | Admitting: Physician Assistant

## 2015-10-16 LAB — URINALYSIS, COMPLETE
Bilirubin, UA: NEGATIVE
GLUCOSE, UA: NEGATIVE
Ketones, UA: NEGATIVE
Leukocytes, UA: NEGATIVE
NITRITE UA: NEGATIVE
PH UA: 7 (ref 5.0–7.5)
Protein, UA: NEGATIVE
RBC, UA: NEGATIVE
SPEC GRAV UA: 1.019 (ref 1.005–1.030)
Urobilinogen, Ur: 0.2 mg/dL (ref 0.2–1.0)

## 2015-10-16 LAB — BMP8+EGFR
BUN / CREAT RATIO: 15 (ref 10–24)
BUN: 16 mg/dL (ref 8–27)
CHLORIDE: 98 mmol/L (ref 96–106)
CO2: 25 mmol/L (ref 18–29)
Calcium: 9.9 mg/dL (ref 8.6–10.2)
Creatinine, Ser: 1.07 mg/dL (ref 0.76–1.27)
GFR, EST AFRICAN AMERICAN: 86 mL/min/{1.73_m2} (ref >59)
GFR, EST NON AFRICAN AMERICAN: 74 mL/min/{1.73_m2} (ref >59)
Glucose: 84 mg/dL (ref 65–99)
POTASSIUM: 4.3 mmol/L (ref 3.5–5.2)
Sodium: 140 mmol/L (ref 134–144)

## 2015-10-16 LAB — HEPATIC FUNCTION PANEL
ALBUMIN: 4.6 g/dL (ref 3.6–4.8)
ALK PHOS: 83 IU/L (ref 39–117)
ALT: 28 IU/L (ref 0–44)
AST: 25 IU/L (ref 0–40)
BILIRUBIN TOTAL: 0.6 mg/dL (ref 0.0–1.2)
BILIRUBIN, DIRECT: 0.13 mg/dL (ref 0.00–0.40)
Total Protein: 7.4 g/dL (ref 6.0–8.5)

## 2015-10-16 LAB — CBC WITH DIFFERENTIAL/PLATELET
BASOS ABS: 0.1 10*3/uL (ref 0.0–0.2)
BASOS: 1 %
EOS (ABSOLUTE): 0.2 10*3/uL (ref 0.0–0.4)
Eos: 3 %
HEMOGLOBIN: 17.7 g/dL (ref 12.6–17.7)
Hematocrit: 49.8 % (ref 37.5–51.0)
Immature Grans (Abs): 0 10*3/uL (ref 0.0–0.1)
Immature Granulocytes: 0 %
LYMPHS ABS: 1.4 10*3/uL (ref 0.7–3.1)
Lymphs: 23 %
MCH: 31.4 pg (ref 26.6–33.0)
MCHC: 35.5 g/dL (ref 31.5–35.7)
MCV: 88 fL (ref 79–97)
Monocytes Absolute: 0.7 10*3/uL (ref 0.1–0.9)
Monocytes: 12 %
NEUTROS ABS: 3.6 10*3/uL (ref 1.4–7.0)
Neutrophils: 61 %
PLATELETS: 236 10*3/uL (ref 150–379)
RBC: 5.64 x10E6/uL (ref 4.14–5.80)
RDW: 13.3 % (ref 12.3–15.4)
WBC: 5.9 10*3/uL (ref 3.4–10.8)

## 2015-10-16 LAB — IRON AND TIBC
Iron Saturation: 39 % (ref 15–55)
Iron: 105 ug/dL (ref 38–169)
Total Iron Binding Capacity: 272 ug/dL (ref 250–450)
UIBC: 167 ug/dL (ref 111–343)

## 2015-10-16 LAB — MICROSCOPIC EXAMINATION
BACTERIA UA: NONE SEEN
CASTS: NONE SEEN /LPF
EPITHELIAL CELLS (NON RENAL): NONE SEEN /HPF (ref 0–10)

## 2015-10-16 LAB — MICROALBUMIN / CREATININE URINE RATIO
Creatinine, Urine: 96.1 mg/dL
MICROALB/CREAT RATIO: 46 mg/g{creat} — AB (ref 0.0–30.0)
MICROALBUM., U, RANDOM: 44.2 ug/mL

## 2015-10-16 LAB — LIPID PANEL W/O CHOL/HDL RATIO
CHOLESTEROL TOTAL: 175 mg/dL (ref 100–199)
HDL: 53 mg/dL (ref >39)
LDL Calculated: 102 mg/dL — ABNORMAL HIGH (ref 0–99)
Triglycerides: 99 mg/dL (ref 0–149)
VLDL Cholesterol Cal: 20 mg/dL (ref 5–40)

## 2015-10-16 LAB — PSA: Prostate Specific Ag, Serum: 0.7 ng/mL (ref 0.0–4.0)

## 2015-10-16 LAB — HIV ANTIBODY (ROUTINE TESTING W REFLEX): HIV SCREEN 4TH GENERATION: NONREACTIVE

## 2015-10-16 LAB — FERRITIN: FERRITIN: 116 ng/mL (ref 30–400)

## 2015-10-16 LAB — VITAMIN D 25 HYDROXY (VIT D DEFICIENCY, FRACTURES): VIT D 25 HYDROXY: 62.5 ng/mL (ref 30.0–100.0)

## 2015-10-16 LAB — MAGNESIUM: Magnesium: 2.1 mg/dL (ref 1.6–2.3)

## 2015-10-16 LAB — HEPATITIS C ANTIBODY: Hep C Virus Ab: <0.1 s/co ratio (ref 0.0–0.9)

## 2015-10-16 LAB — TSH: TSH: 0.574 u[IU]/mL (ref 0.450–4.500)

## 2015-11-20 ENCOUNTER — Other Ambulatory Visit: Payer: Self-pay | Admitting: *Deleted

## 2015-11-20 DIAGNOSIS — Z0001 Encounter for general adult medical examination with abnormal findings: Secondary | ICD-10-CM

## 2015-11-20 DIAGNOSIS — Z1212 Encounter for screening for malignant neoplasm of rectum: Secondary | ICD-10-CM

## 2015-11-20 LAB — POC HEMOCCULT BLD/STL (HOME/3-CARD/SCREEN)
Card #3 Fecal Occult Blood, POC: NEGATIVE
FECAL OCCULT BLD: NEGATIVE
Fecal Occult Blood, POC: NEGATIVE

## 2015-11-21 ENCOUNTER — Other Ambulatory Visit: Payer: Self-pay | Admitting: Internal Medicine

## 2015-11-21 DIAGNOSIS — E039 Hypothyroidism, unspecified: Secondary | ICD-10-CM

## 2016-02-16 ENCOUNTER — Ambulatory Visit (INDEPENDENT_AMBULATORY_CARE_PROVIDER_SITE_OTHER): Payer: 59 | Admitting: Internal Medicine

## 2016-02-16 ENCOUNTER — Encounter: Payer: Self-pay | Admitting: Internal Medicine

## 2016-02-16 VITALS — BP 136/88 | HR 100 | Temp 98.4°F | Resp 18 | Ht 73.5 in | Wt 280.0 lb

## 2016-02-16 DIAGNOSIS — J069 Acute upper respiratory infection, unspecified: Secondary | ICD-10-CM | POA: Diagnosis not present

## 2016-02-16 MED ORDER — AZITHROMYCIN 250 MG PO TABS: ORAL_TABLET | ORAL | 0 refills | Status: DC

## 2016-02-16 MED ORDER — FLUTICASONE PROPIONATE 50 MCG/ACT NA SUSP: 2.0000 | Freq: Every day | NASAL | 0 refills | Status: DC

## 2016-02-16 MED ORDER — AZELASTINE HCL 0.1 % NA SOLN: 2.0000 | Freq: Two times a day (BID) | NASAL | 2 refills | Status: DC

## 2016-02-16 MED ORDER — PREDNISONE 20 MG PO TABS: ORAL_TABLET | ORAL | 0 refills | Status: DC

## 2016-02-16 MED ORDER — PROMETHAZINE-DM 6.25-15 MG/5ML PO SYRP: ORAL_SOLUTION | ORAL | 1 refills | Status: DC

## 2016-02-16 NOTE — Progress Notes (Signed)
HPI  Patient presents to the office for evaluation of cough.  It has been going on for 4 days.  Patient reports night > day, dry, barky, worse with lying down.  They also endorse change in voice, postnasal drip and nasal congestion, clear rhinorrhea, scratchy sore throat, mild headaches.  .  They have tried theraflu, salt water gargles and cough losenges.  They report that nothing has worked.  They admits to other sick contacts.  He has a Mudlogger with similar symptoms.    Review of Systems  Constitutional: Positive for malaise/fatigue. Negative for chills and fever.  HENT: Positive for congestion, ear pain, hearing loss and sore throat.   Respiratory: Positive for cough. Negative for sputum production, shortness of breath and wheezing.   Cardiovascular: Negative for chest pain, palpitations and leg swelling.  Neurological: Positive for headaches.    PE:  Vitals:   02/16/16 1410  BP: 136/88  Pulse: 100  Resp: 18  Temp: 98.4 F (36.9 C)    General:  Alert and non-toxic, WDWN, NAD HEENT: NCAT, PERLA, EOM normal, no occular discharge or erythema.  Nasal mucosal edema with sinus tenderness to palpation.  Oropharynx clear with minimal oropharyngeal edema and erythema.  Mucous membranes moist and pink. Neck:  Cervical adenopathy Chest:  RRR no MRGs.  Lungs clear to auscultation A&P with no wheezes rhonchi or rales.   Abdomen: +BS x 4 quadrants, soft, non-tender, no guarding, rigidity, or rebound. Skin: warm and dry no rash Neuro: A&Ox4, CN II-XII grossly intact  Assessment and Plan:   1. Acute URI -flonase -phenergan dm -prednisone -nasal saline -astelin -if no relief than post dated zpak to be available at 02/22/16 -patient to call if worsening symptoms

## 2016-03-23 ENCOUNTER — Other Ambulatory Visit: Payer: Self-pay | Admitting: Internal Medicine

## 2016-04-19 ENCOUNTER — Other Ambulatory Visit: Payer: Self-pay | Admitting: Internal Medicine

## 2016-04-19 ENCOUNTER — Ambulatory Visit (INDEPENDENT_AMBULATORY_CARE_PROVIDER_SITE_OTHER): Payer: 59 | Admitting: Internal Medicine

## 2016-04-19 ENCOUNTER — Encounter: Payer: Self-pay | Admitting: Internal Medicine

## 2016-04-19 VITALS — BP 168/110 | HR 72 | Temp 97.3°F | Resp 16 | Ht 73.5 in | Wt 288.0 lb

## 2016-04-19 DIAGNOSIS — E782 Mixed hyperlipidemia: Secondary | ICD-10-CM

## 2016-04-19 DIAGNOSIS — Z79899 Other long term (current) drug therapy: Secondary | ICD-10-CM

## 2016-04-19 DIAGNOSIS — I10 Essential (primary) hypertension: Secondary | ICD-10-CM

## 2016-04-19 DIAGNOSIS — R7309 Other abnormal glucose: Secondary | ICD-10-CM | POA: Diagnosis not present

## 2016-04-19 DIAGNOSIS — IMO0001 Reserved for inherently not codable concepts without codable children: Secondary | ICD-10-CM

## 2016-04-19 DIAGNOSIS — Z6837 Body mass index (BMI) 37.0-37.9, adult: Secondary | ICD-10-CM

## 2016-04-19 DIAGNOSIS — E039 Hypothyroidism, unspecified: Secondary | ICD-10-CM

## 2016-04-19 DIAGNOSIS — E6609 Other obesity due to excess calories: Secondary | ICD-10-CM | POA: Diagnosis not present

## 2016-04-19 DIAGNOSIS — E669 Obesity, unspecified: Secondary | ICD-10-CM | POA: Insufficient documentation

## 2016-04-19 DIAGNOSIS — E559 Vitamin D deficiency, unspecified: Secondary | ICD-10-CM

## 2016-04-19 MED ORDER — BISOPROLOL FUMARATE 10 MG PO TABS: ORAL_TABLET | ORAL | 3 refills | Status: DC

## 2016-04-19 MED ORDER — LEVOTHYROXINE SODIUM 175 MCG PO TABS: ORAL_TABLET | ORAL | 3 refills | Status: DC

## 2016-04-19 NOTE — Progress Notes (Signed)
This very nice 61 y.o. MWM presents for 3 month follow up with Hypertension, Hyperlipidemia, Pre-Diabetes and Vitamin D Deficiency.      Patient is treated for HTN (1990's) & BP has been controlled at home ibn the range of 130-140/70-80's. Today's BP is very elevated at 168/110.  Patient has had no complaints of any cardiac type chest pain, palpitations, dyspnea/orthopnea/PND, dizziness, claudication, or dependent edema.     Hyperlipidemia is controlled with diet & meds. Patient denies myalgias or other med SE's. Last Lipids were almost at goal: Lab Results  Component Value Date   CHOL 175 10/15/2015   HDL 53 10/15/2015   LDLCALC 102 (H) 10/15/2015   TRIG 99 10/15/2015   CHOLHDL 2.9 04/07/2014      Also, the patient  Has morbid Obesity (BMI 37+) and consequently is expectantly screened for PreDiabetes and has had no symptoms of reactive hypoglycemia, diabetic polys, paresthesias or visual blurring.  Last A1c was at goal: Lab Results  Component Value Date   HGBA1C 5.3 04/15/2015      Also, patient is on thyroid replacement since RAI -131 tx for Graves' Dz in 1990.  Further, the patient also has history of Vitamin D Deficiency and supplements vitamin D without any suspected side-effects. Last vitamin D was at goal: Lab Results  Component Value Date   VD25OH 62.5 10/15/2015   Current Outpatient Prescriptions on File Prior to Visit  Medication Sig  . ASTELIN nasal spray 2 sprays into  nostrils 2 (two) times daily.   Marland Kitchen BABY ASPIRIN  Take 81 mg by mouth daily.  . TUMS  Take by mouth as needed.  Marland Kitchen VITAMIN D  Take 2,000 Units by mouth 3 x  daily.   Marland Kitchen FLAX SEED OIL  Take by mouth daily. Takes 2 caps daily  . FLONASE nasal spray Place 2 sprays into both nostrils daily.  Marland Kitchen levothyroxine  150 MCG tablet TAKE 1 TAB 5 x/wk and 1.5 tab 2 x/week  . losartan-hctz 100-25 MG tablet TAKE 1 TABLET BY MOUTH  DAILY    No Known Allergies  PMHx:   Past Medical History:  Diagnosis Date  .  Hyperlipidemia   . Hypertension   . Thyroid disease   . Vitamin D deficiency    Immunization History  Administered Date(s) Administered  . Influenza Split 09/26/2012  . PPD Test 04/01/2013  . Pneumococcal Polysaccharide-23 09/16/2008  . Td 08/22/2006  . Zoster 01/10/2006   Past Surgical History:  Procedure Laterality Date  . TONSILLECTOMY AND ADENOIDECTOMY     FHx:    Reviewed / unchanged  SHx:    Reviewed / unchanged  Systems Review:  Constitutional: Denies fever, chills, wt changes, headaches, insomnia, fatigue, night sweats, change in appetite. Eyes: Denies redness, blurred vision, diplopia, discharge, itchy, watery eyes.  ENT: Denies discharge, congestion, post nasal drip, epistaxis, sore throat, earache, hearing loss, dental pain, tinnitus, vertigo, sinus pain, snoring.  CV: Denies chest pain, palpitations, irregular heartbeat, syncope, dyspnea, diaphoresis, orthopnea, PND, claudication or edema. Respiratory: denies cough, dyspnea, DOE, pleurisy, hoarseness, laryngitis, wheezing.  Gastrointestinal: Denies dysphagia, odynophagia, heartburn, reflux, water brash, abdominal pain or cramps, nausea, vomiting, bloating, diarrhea, constipation, hematemesis, melena, hematochezia  or hemorrhoids. Genitourinary: Denies dysuria, frequency, urgency, nocturia, hesitancy, discharge, hematuria or flank pain. Musculoskeletal: Denies arthralgias, myalgias, stiffness, jt. swelling, pain, limping or strain/sprain.  Skin: Denies pruritus, rash, hives, warts, acne, eczema or change in skin lesion(s). Neuro: No weakness, tremor, incoordination, spasms, paresthesia or pain.  Psychiatric: Denies confusion, memory loss or sensory loss. Endo: Denies change in weight, skin or hair change.  Heme/Lymph: No excessive bleeding, bruising or enlarged lymph nodes.  Physical Exam  BP (!) 168/110   Pulse 72   Temp 97.3 F (36.3 C)   Resp 16   Ht 6' 1.5" (1.867 m)   Wt 288 lb (130.6 kg)   BMI 37.48 kg/m     Appears Over nourished, well groomed  and in no distress.  Eyes: PERRLA, EOMs, conjunctiva no swelling or erythema. Sinuses: No frontal/maxillary tenderness ENT/Mouth: EAC's clear, TM's nl w/o erythema, bulging. Nares clear w/o erythema, swelling, exudates. Oropharynx clear without erythema or exudates. Oral hygiene is good. Tongue normal, non obstructing. Hearing intact.  Neck: Supple. Thyroid nl. Car 2+/2+ without bruits, nodes or JVD. Chest: Respirations nl with BS clear & equal w/o rales, rhonchi, wheezing or stridor.  Cor: Heart sounds normal w/ regular rate and rhythm without sig. murmurs, gallops, clicks or rubs. Peripheral pulses normal and equal  without edema.  Abdomen: Soft & bowel sounds normal. Non-tender w/o guarding, rebound, hernias, masses or organomegaly.  Lymphatics: Unremarkable.  Musculoskeletal: Full ROM all peripheral extremities, joint stability, 5/5 strength and normal gait.  Skin: Warm, dry without exposed rashes, lesions or ecchymosis apparent.  Neuro: Cranial nerves intact, reflexes equal bilaterally. Sensory-motor testing grossly intact. Tendon reflexes grossly intact.  Pysch: Alert & oriented x 3.  Insight and judgement nl & appropriate. No ideations.  Assessment and Plan:  1. Essential hypertension  - Continue medication, monitor blood pressure at home.  - Continue DASH diet. Reminder to go to the ER if any CP,  SOB, nausea, dizziness, severe HA, changes vision/speech,  left arm numbness and tingling and jaw pain. - bisoprolol (ZEBETA) 10 MG tablet; Take 1/2 to 1 tablet daily for BP  Dispense: 90 tablet; Refill: 3  2. Mixed hyperlipidemia  - Continue diet/meds, exercise,& lifestyle modifications.  - Continue monitor periodic cholesterol/liver & renal functions   3. Abnormal glucose  - Continue diet, exercise, lifestyle modifications.  - Monitor appropriate labs.  4. Vitamin D deficiency  - Continue supplementation.  5. Hypothyroidism,   -  dose re-calculated at patient request to obviate splitting tablets several days/week  - New Rx - levothyroxine (SYNTHROID) 175 MCG tablet; Take 1 tablet  1st in am with only water for 30 min and no antacid meds for 4 hours  Dispense: 90 tablet; Refill: 3  6. Medication management   7. Class 2 obesity due to excess calories with serious comorbidity and body mass index (BMI) of 37.0 to 37.9 in adult  - discussed Dr Lear Ng books on "The End of Dieting & The End of Diabetes"       Discussed  regular exercise, BP monitoring, weight control to achieve/maintain BMI less than 25 and discussed med and SE's. Recommended labs (to be done at [facility] where he works)  to assess and monitor clinical status with further disposition pending results of labs. Over 30 minutes of exam, counseling, chart review was performed.

## 2016-04-19 NOTE — Patient Instructions (Signed)

## 2016-04-20 ENCOUNTER — Other Ambulatory Visit: Payer: Self-pay | Admitting: Internal Medicine

## 2016-04-20 DIAGNOSIS — R7309 Other abnormal glucose: Secondary | ICD-10-CM | POA: Diagnosis not present

## 2016-04-20 DIAGNOSIS — I10 Essential (primary) hypertension: Secondary | ICD-10-CM | POA: Diagnosis not present

## 2016-04-20 DIAGNOSIS — E782 Mixed hyperlipidemia: Secondary | ICD-10-CM | POA: Diagnosis not present

## 2016-04-21 LAB — COMPREHENSIVE METABOLIC PANEL
A/G RATIO: 1.6 (ref 1.2–2.2)
ALBUMIN: 4.4 g/dL (ref 3.6–4.8)
ALT: 35 IU/L (ref 0–44)
AST: 32 IU/L (ref 0–40)
Alkaline Phosphatase: 85 IU/L (ref 39–117)
BILIRUBIN TOTAL: 0.5 mg/dL (ref 0.0–1.2)
BUN / CREAT RATIO: 13 (ref 10–24)
BUN: 15 mg/dL (ref 8–27)
CHLORIDE: 102 mmol/L (ref 96–106)
CO2: 27 mmol/L (ref 18–29)
Calcium: 10 mg/dL (ref 8.6–10.2)
Creatinine, Ser: 1.16 mg/dL (ref 0.76–1.27)
GFR calc non Af Amer: 67 mL/min/{1.73_m2} (ref >59)
GFR, EST AFRICAN AMERICAN: 78 mL/min/{1.73_m2} (ref >59)
GLUCOSE: 90 mg/dL (ref 65–99)
Globulin, Total: 2.8 g/dL (ref 1.5–4.5)
POTASSIUM: 4.9 mmol/L (ref 3.5–5.2)
Sodium: 142 mmol/L (ref 134–144)
Total Protein: 7.2 g/dL (ref 6.0–8.5)

## 2016-04-21 LAB — TSH: TSH: 0.783 u[IU]/mL (ref 0.450–4.500)

## 2016-04-21 LAB — LIPID PANEL W/O CHOL/HDL RATIO
Cholesterol, Total: 184 mg/dL (ref 100–199)
HDL: 49 mg/dL (ref >39)
LDL Calculated: 114 mg/dL — ABNORMAL HIGH (ref 0–99)
Triglycerides: 103 mg/dL (ref 0–149)
VLDL CHOLESTEROL CAL: 21 mg/dL (ref 5–40)

## 2016-04-21 LAB — MAGNESIUM: Magnesium: 2.1 mg/dL (ref 1.6–2.3)

## 2016-04-21 LAB — HEPATIC FUNCTION PANEL: Bilirubin, Direct: 0.13 mg/dL (ref 0.00–0.40)

## 2016-04-21 LAB — VITAMIN D 25 HYDROXY (VIT D DEFICIENCY, FRACTURES): Vit D, 25-Hydroxy: 71.4 ng/mL (ref 30.0–100.0)

## 2016-04-21 LAB — HGB A1C W/O EAG: Hgb A1c MFr Bld: 5.4 % (ref 4.8–5.6)

## 2016-04-24 ENCOUNTER — Other Ambulatory Visit: Payer: Self-pay | Admitting: Internal Medicine

## 2016-07-19 NOTE — Progress Notes (Signed)
Patient ID: Julian Lee, male   DOB: 1955-12-15, 61 y.o.   MRN: [id number]  Assessment and Plan:  Hypertension:  -switch BB to AB, instructions for doxazosin given to patient Call if any issues -Continue medication,  -monitor blood pressure at home.  -Continue DASH diet.   -Reminder to go to the ER if any CP, SOB, nausea, dizziness, severe HA, changes vision/speech, left arm numbness and tingling, and jaw pain.  Cholesterol: -Continue diet and exercise.  -Check cholesterol.  -labs being done at outside facility per patients request.  See fax for results  Pre-diabetes: -Continue diet and exercise.  -Check A1C -see outside labs fax for results  Vitamin D Def: -check level -continue medications.   Hypothyroid -Cont Levothyroxine -tsh level  Morbid Obesity with co morbidities - long discussion about weight loss, diet, and exercise  Continue diet and meds as discussed. Further disposition pending results of labs. LABS DONE AT [facility] Future Appointments Date Time Provider [place]  11/07/2016 2:00 PM Vicie Mutters, PA-C [id number] None    HPI 61 y.o. male  presents for 3 month follow up with hypertension, hyperlipidemia, prediabetes and vitamin D.   His blood pressure has been controlled at home,  today their BP is BP: (!) 128/94.   He does workout. He denies chest pain, shortness of breath, dizziness. States that since he started the beta blocker that he can not sleep well, having bad dreams and waking up 2-3 hours after it.    He is not on cholesterol medication and denies myalgias. His cholesterol is at goal. The cholesterol last visit was:   Lab Results  Component Value Date   CHOL 184 04/20/2016   HDL 49 04/20/2016   LDLCALC 114 (H) 04/20/2016   TRIG 103 04/20/2016   CHOLHDL 2.9 04/07/2014    He has been working on diet and exercise for prediabetes, and denies foot ulcerations, hyperglycemia, hypoglycemia , increased appetite, nausea, paresthesia  of the feet, polydipsia, polyuria, visual disturbances, vomiting and weight loss. Last A1C in the office was:  Lab Results  Component Value Date   HGBA1C 5.4 04/20/2016   Patient is on Vitamin D supplement.  Lab Results  Component Value Date   VD25OH 71.4 04/20/2016     BMI is Body mass index is 38 kg/m., he is working on diet and exercise. Wt Readings from Last 3 Encounters:  07/20/16 292 lb (132.5 kg)  04/19/16 288 lb (130.6 kg)  02/16/16 280 lb (127 kg)   He is on thyroid medication. His medication was not changed last visit.   Lab Results  Component Value Date   TSH 0.783 04/20/2016  .   Current Medications:  Current Outpatient Prescriptions on File Prior to Visit  Medication Sig Dispense Refill  . BABY ASPIRIN PO Take 81 mg by mouth daily.    . bisoprolol (ZEBETA) 10 MG tablet Take 1/2 to 1 tablet daily for BP 90 tablet 3  . Calcium Carbonate Antacid (TUMS PO) Take by mouth as needed.    . Cholecalciferol (VITAMIN D PO) Take 2,000 Units by mouth 3 (three) times daily.     . Flaxseed, Linseed, (FLAX SEED OIL PO) Take by mouth daily. Takes 2 caps daily    . levothyroxine (SYNTHROID) 175 MCG tablet Take 1 tablet  1st in am with only water for 30 min and no antacid meds for 4 hours 90 tablet 3  . losartan-hydrochlorothiazide (HYZAAR) 100-25 MG tablet TAKE 1 TABLET BY MOUTH  DAILY FOR  BLOOD PRESSURE 90 tablet 1   No current facility-administered medications on file prior to visit.     Medical History:  Past Medical History:  Diagnosis Date  . Hyperlipidemia   . Hypertension   . Thyroid disease   . Vitamin D deficiency     Allergies: No Known Allergies   Review of Systems:  Review of Systems  Constitutional: Negative for chills, fever and malaise/fatigue.  HENT: Negative for congestion, ear pain and sore throat.   Eyes: Negative.   Respiratory: Negative for cough, shortness of breath and wheezing.   Cardiovascular: Negative for chest pain, palpitations and leg  swelling.  Gastrointestinal: Negative for blood in stool, constipation, diarrhea, heartburn and melena.  Genitourinary: Negative.   Skin: Negative.   Neurological: Negative for dizziness, sensory change and headaches.  Psychiatric/Behavioral: Negative for depression. The patient is not nervous/anxious and does not have insomnia.     Family history- Review and unchanged  Social history- Review and unchanged  Physical Exam: BP (!) 128/94   Pulse (!) 55   Temp (!) 97.5 F (36.4 C)   Resp 14   Ht 6' 1.5" (1.867 m)   Wt 292 lb (132.5 kg)   SpO2 95%   BMI 38.00 kg/m  Wt Readings from Last 3 Encounters:  07/20/16 292 lb (132.5 kg)  04/19/16 288 lb (130.6 kg)  02/16/16 280 lb (127 kg)    General Appearance: Well nourished well developed, in no apparent distress. Eyes: PERRLA, EOMs, conjunctiva no swelling or erythema ENT/Mouth: Ear canals normal without obstruction, swelling, erythma, discharge.  TMs normal bilaterally.  Oropharynx moist, clear, without exudate, or postoropharyngeal swelling. Neck: Supple, thyroid normal,no cervical adenopathy  Respiratory: Respiratory effort normal, Breath sounds clear A&P without rhonchi, wheeze, or rale.  No retractions, no accessory usage. Cardio: RRR with no MRGs. Brisk peripheral pulses without edema.  Abdomen: Soft, + BS,  Non tender, no guarding, rebound, hernias, masses. Musculoskeletal: Full ROM, 5/5 strength, Normal gait Skin: Warm, dry without rashes, lesions, ecchymosis.  Neuro: Awake and oriented X 3, Cranial nerves intact. Normal muscle tone, no cerebellar symptoms. Psych: Normal affect, Insight and Judgment appropriate.    Vicie Mutters, PA-C 11:10 AM [facility]

## 2016-07-20 ENCOUNTER — Ambulatory Visit (INDEPENDENT_AMBULATORY_CARE_PROVIDER_SITE_OTHER): Payer: 59 | Admitting: Physician Assistant

## 2016-07-20 ENCOUNTER — Encounter: Payer: Self-pay | Admitting: Physician Assistant

## 2016-07-20 VITALS — BP 128/94 | HR 55 | Temp 97.5°F | Resp 14 | Ht 73.5 in | Wt 292.0 lb

## 2016-07-20 DIAGNOSIS — E785 Hyperlipidemia, unspecified: Secondary | ICD-10-CM | POA: Diagnosis not present

## 2016-07-20 DIAGNOSIS — I10 Essential (primary) hypertension: Secondary | ICD-10-CM

## 2016-07-20 DIAGNOSIS — E039 Hypothyroidism, unspecified: Secondary | ICD-10-CM | POA: Diagnosis not present

## 2016-07-20 DIAGNOSIS — R7309 Other abnormal glucose: Secondary | ICD-10-CM | POA: Diagnosis not present

## 2016-07-20 DIAGNOSIS — Z79899 Other long term (current) drug therapy: Secondary | ICD-10-CM | POA: Diagnosis not present

## 2016-07-20 MED ORDER — DOXAZOSIN MESYLATE 4 MG PO TABS: 4.0000 mg | ORAL_TABLET | Freq: Every day | ORAL | 11 refills | Status: DC

## 2016-07-20 MED ORDER — LOSARTAN POTASSIUM-HCTZ 100-25 MG PO TABS: 1.0000 | ORAL_TABLET | Freq: Every day | ORAL | 1 refills | Status: DC

## 2016-07-20 NOTE — Patient Instructions (Addendum)
Stop the bisoprolol and try doxazosin at night for blood pressure and sleep  Start 1/4 of the doxazosin at night before bed for 1 week, then do 1/2 of the doxazosin at night for 1 week. Stay on the doxazosin or increase to 1 pill. If you get dizzy with the doxazosin go back to the previous dose. This medication can cause hypotension so be careful with standing in the beginning and decrease the dose if you get dizzy. Please drink plenty of fluids.   Try the melatonin 5mg -20mg  dissolvable or gummy 30 mins before bed    Simple math prevails.    1st - exercise does not produce significant weight loss - at best one converts fat into muscle , "bulks up", loses inches, but usually stays "weight neutral"     2nd - think of your body weightas a check book: If you eat more calories than you burn up - you save money or gain weight .... Or if you spend more money than you put in the check book, ie burn up more calories than you eat, then you lose weight     3rd - if you walk or run 1 mile, you burn up 100 calories - you have to burn up 3,500 calories to lose 1 pound, ie you have to walk/run 35 miles to lose 1 measly pound. So if you want to lose 10 #, then you have to walk/run 350 miles, so.... clearly exercise is not the solution.     4. So if you consume 1,500 calories, then you have to burn up the equivalent of 15 miles to stay weight neutral - It also stands to reason that if you consume 1,500 cal/day and don't lose weight, then you must be burning up about 1,500 cals/day to stay weight neutral.     5. If you really want to lose weight, you must cut your calorie intake 300 calories /day and at that rate you should lose about 1 # every 3 days.   6. Please purchase Dr Fara Olden Fuhrman's book(s) "The End of Dieting" & "Eat to Live" . It has some great concepts and recipes.         Bad carbs also include fruit juice, alcohol, and sweet tea. These are empty calories that do not signal to your brain that  you are full.   Please remember the good carbs are still carbs which convert into sugar. So please measure them out no more than 1/2-1 cup of rice, oatmeal, pasta, and beans  Veggies are however free foods! Pile them on.   Not all fruit is created equal. Please see the list below, the fruit at the bottom is higher in sugars than the fruit at the top. Please avoid all dried fruits.     11 Tips to Follow:  5. No caffeine after 3pm: Avoid beverages with caffeine (soda, tea, energy drinks, etc.) especially after 3pm. 2. Don't go to bed hungry: Have your evening meal at least 3 hrs. before going to sleep. It's fine to have a small bedtime snack such as a glass of milk and a few crackers but don't have a big meal. 3. Have a nightly routine before bed: Plan on "winding down" before you go to sleep. Begin relaxing about 1 hour before you go to bed. Try doing a quiet activity such as listening to calming music, reading a book or meditating. 4. Turn off the TV and ALL electronics including video games, tablets, laptops, etc. 1 hour  before sleep, and keep them out of the bedroom. 5. Turn off your cell phone and all notifications (new email and text alerts) or even better, leave your phone outside your room while you sleep. Studies have shown that a part of your brain continues to respond to certain lights and sounds even while you're still asleep. 6. Make your bedroom quiet, dark and cool. If you can't control the noise, try wearing earplugs or using a fan to block out other sounds. 7. Practice relaxation techniques. Try reading a book or meditating or drain your brain by writing a list of what you need to do the next day. 8. Don't nap unless you feel sick: you'll have a better night's sleep. 9. Don't smoke, or quit if you do. Nicotine, alcohol, and marijuana can all keep you awake. Talk to your health care provider if you need help with substance use. 10. Most importantly, wake up at the same time every day  (or within 1 hour of your usual wake up time) EVEN on the weekends. A regular wake up time promotes sleep hygiene and prevents sleep problems. 11. Reduce exposure to bright light in the last three hours of the day before going to sleep. Maintaining good sleep hygiene and having good sleep habits lower your risk of developing sleep problems. Getting better sleep can also improve your concentration and alertness. Try the simple steps in this guide. If you still have trouble getting enough rest, make an appointment with your health care provider.

## 2016-07-21 ENCOUNTER — Encounter: Payer: Self-pay | Admitting: Physician Assistant

## 2016-07-21 ENCOUNTER — Other Ambulatory Visit: Payer: Self-pay | Admitting: Physician Assistant

## 2016-07-21 DIAGNOSIS — Z79899 Other long term (current) drug therapy: Secondary | ICD-10-CM | POA: Diagnosis not present

## 2016-07-21 DIAGNOSIS — I10 Essential (primary) hypertension: Secondary | ICD-10-CM | POA: Diagnosis not present

## 2016-07-21 DIAGNOSIS — E785 Hyperlipidemia, unspecified: Secondary | ICD-10-CM | POA: Diagnosis not present

## 2016-07-22 LAB — BASIC METABOLIC PANEL
BUN / CREAT RATIO: 12 (ref 10–24)
BUN: 15 mg/dL (ref 8–27)
CO2: 24 mmol/L (ref 20–29)
Calcium: 10.3 mg/dL — ABNORMAL HIGH (ref 8.6–10.2)
Chloride: 101 mmol/L (ref 96–106)
Creatinine, Ser: 1.27 mg/dL (ref 0.76–1.27)
GFR calc Af Amer: 70 mL/min/{1.73_m2} (ref >59)
GFR, EST NON AFRICAN AMERICAN: 60 mL/min/{1.73_m2} (ref >59)
Glucose: 94 mg/dL (ref 65–99)
Potassium: 4.4 mmol/L (ref 3.5–5.2)
SODIUM: 141 mmol/L (ref 134–144)

## 2016-07-22 LAB — HEPATIC FUNCTION PANEL
ALBUMIN: 4.4 g/dL (ref 3.6–4.8)
ALT: 36 IU/L (ref 0–44)
AST: 34 IU/L (ref 0–40)
Alkaline Phosphatase: 71 IU/L (ref 39–117)
BILIRUBIN TOTAL: 0.6 mg/dL (ref 0.0–1.2)
Bilirubin, Direct: 0.14 mg/dL (ref 0.00–0.40)
Total Protein: 7 g/dL (ref 6.0–8.5)

## 2016-07-22 LAB — LIPID PANEL W/O CHOL/HDL RATIO
Cholesterol, Total: 174 mg/dL (ref 100–199)
HDL: 44 mg/dL (ref >39)
LDL CALC: 105 mg/dL — AB (ref 0–99)
TRIGLYCERIDES: 125 mg/dL (ref 0–149)
VLDL CHOLESTEROL CAL: 25 mg/dL (ref 5–40)

## 2016-07-22 LAB — CBC WITH DIFFERENTIAL/PLATELET
BASOS: 0 %
Basophils Absolute: 0 10*3/uL (ref 0.0–0.2)
EOS (ABSOLUTE): 0.1 10*3/uL (ref 0.0–0.4)
EOS: 2 %
HEMATOCRIT: 50.7 % (ref 37.5–51.0)
HEMOGLOBIN: 16.5 g/dL (ref 13.0–17.7)
Immature Grans (Abs): 0 10*3/uL (ref 0.0–0.1)
Immature Granulocytes: 0 %
LYMPHS ABS: 1 10*3/uL (ref 0.7–3.1)
Lymphs: 21 %
MCH: 29 pg (ref 26.6–33.0)
MCHC: 32.5 g/dL (ref 31.5–35.7)
MCV: 89 fL (ref 79–97)
MONOCYTES: 13 %
Monocytes Absolute: 0.6 10*3/uL (ref 0.1–0.9)
NEUTROS ABS: 3.1 10*3/uL (ref 1.4–7.0)
Neutrophils: 64 %
Platelets: 206 10*3/uL (ref 150–379)
RBC: 5.69 x10E6/uL (ref 4.14–5.80)
RDW: 13.3 % (ref 12.3–15.4)
WBC: 4.9 10*3/uL (ref 3.4–10.8)

## 2016-07-22 LAB — MAGNESIUM: MAGNESIUM: 2 mg/dL (ref 1.6–2.3)

## 2016-07-22 LAB — TSH: TSH: 0.471 u[IU]/mL (ref 0.450–4.500)

## 2016-07-28 ENCOUNTER — Other Ambulatory Visit: Payer: Self-pay

## 2016-07-28 DIAGNOSIS — I10 Essential (primary) hypertension: Secondary | ICD-10-CM

## 2016-07-28 MED ORDER — DOXAZOSIN MESYLATE 4 MG PO TABS: 4.0000 mg | ORAL_TABLET | Freq: Every day | ORAL | 11 refills | Status: DC

## 2016-11-07 ENCOUNTER — Encounter: Payer: Self-pay | Admitting: Physician Assistant

## 2016-11-07 ENCOUNTER — Ambulatory Visit (INDEPENDENT_AMBULATORY_CARE_PROVIDER_SITE_OTHER): Payer: 59 | Admitting: Physician Assistant

## 2016-11-07 VITALS — BP 126/70 | HR 64 | Temp 97.5°F | Resp 14 | Ht 73.5 in | Wt 265.0 lb

## 2016-11-07 DIAGNOSIS — Z79899 Other long term (current) drug therapy: Secondary | ICD-10-CM

## 2016-11-07 DIAGNOSIS — Z6834 Body mass index (BMI) 34.0-34.9, adult: Secondary | ICD-10-CM

## 2016-11-07 DIAGNOSIS — Z0000 Encounter for general adult medical examination without abnormal findings: Secondary | ICD-10-CM

## 2016-11-07 DIAGNOSIS — E039 Hypothyroidism, unspecified: Secondary | ICD-10-CM

## 2016-11-07 DIAGNOSIS — E559 Vitamin D deficiency, unspecified: Secondary | ICD-10-CM

## 2016-11-07 DIAGNOSIS — Z23 Encounter for immunization: Secondary | ICD-10-CM

## 2016-11-07 DIAGNOSIS — R7309 Other abnormal glucose: Secondary | ICD-10-CM

## 2016-11-07 DIAGNOSIS — I10 Essential (primary) hypertension: Secondary | ICD-10-CM

## 2016-11-07 DIAGNOSIS — E785 Hyperlipidemia, unspecified: Secondary | ICD-10-CM

## 2016-11-07 NOTE — Progress Notes (Signed)
Complete Physical  Assessment and Plan: Essential hypertension - continue medications, DASH diet, exercise and monitor at home. Call if greater than 130/80.  - MONITOR AT HOME, MAY NEED TO ADD ALPHA BLOCKER AT NIGHT - EKG 12-Lead   Hypothyroidism, unspecified type Hypothyroidism-check TSH level, continue medications the same, reminded to take on an empty stomach 30-64mins before food.    Hyperlipidemia, unspecified hyperlipidemia type -continue medications, check lipids, decrease fatty foods, increase activity.    Morbid obesity  - long discussion about weight loss, diet, and exercise  Medication management  Vitamin D deficiency   BPH without obstruction/lower urinary tract symptoms Declines medications at this time, check PSA  Screening for rectal cancer Will call again about colo guard next year, had poor experience with colonosocpy prep and prefers not to do it.    Encounter for general adult medical examination with abnormal findings   Discussed med's effects and SE's. Screening labs and tests as requested with regular follow-up as recommended. [facility]S DONE [facility]  HPI He has had elevated blood pressure for 15 + years. His blood pressure has not been checked at home but he admits to white coat syndrome, he is on losartan 100mg  daily, last visit a HCTZ 25mg  was added to the losartan, today their BP is BP: 126/70  Lab Results  Component Value Date   GFRNONAA 60 07/21/2016   He does workout, does cardio 30 mins 5 x a week.  He denies chest pain, shortness of breath, dizziness.  He has history of eczema on his hands, he needs refill of triamcinolone.  He is not on cholesterol medication and denies myalgias. His cholesterol is at goal. The cholesterol last visit was:   Lab Results  Component Value Date   CHOL 174 07/21/2016   HDL 44 07/21/2016   LDLCALC 105 (H) 07/21/2016   TRIG 125 07/21/2016   CHOLHDL 2.9 04/07/2014    Last A1C in the office was:  Lab Results   Component Value Date   HGBA1C 5.4 04/20/2016   Patient is on Vitamin D supplement.   Lab Results  Component Value Date   VD25OH 71.4 04/20/2016     He is on thyroid medication. His medication was not changed last visit. Patient denies nervousness, palpitations and weight changes.  Lab Results  Component Value Date   TSH 0.471 07/21/2016   BMI is Body mass index is 34.49 kg/m., he is working on diet and exercise. Wt Readings from Last 3 Encounters:  11/07/16 265 lb (120.2 kg)  07/20/16 292 lb (132.5 kg)  04/19/16 288 lb (130.6 kg)     Current Medications:  Current Outpatient Prescriptions on File Prior to Visit  Medication Sig Dispense Refill  . BABY ASPIRIN PO Take 81 mg by mouth daily.    . Cholecalciferol (VITAMIN D PO) Take 2,000 Units by mouth 3 (three) times daily.     Marland Kitchen doxazosin (CARDURA) 4 MG tablet Take 1 tablet (4 mg total) by mouth daily. 30 tablet 11  . Flaxseed, Linseed, (FLAX SEED OIL PO) Take by mouth daily. Takes 2 caps daily    . levothyroxine (SYNTHROID) 175 MCG tablet Take 1 tablet  1st in am with only water for 30 min and no antacid meds for 4 hours 90 tablet 3  . losartan-hydrochlorothiazide (HYZAAR) 100-25 MG tablet Take 1 tablet by mouth daily. for blood pressure 90 tablet 1   No current facility-administered medications on file prior to visit.    Health Maintenance:  Immunization  History  Administered Date(s) Administered  . Influenza Split 09/26/2012  . PPD Test 04/01/2013  . Pneumococcal Polysaccharide-23 09/16/2008  . Td 08/22/2006  . Zoster 01/10/2006   Tetanus: 2008 TODAY Pneumovax: 2010 Prevnar 13: due age 35 Flu vaccine: 2017 TODAY Zostavax: 2008  DEXA: CXR 2014 Colonoscopy:2005, has called insurance and cologuard not covered at this time, will call back with information Cardiolite negative 2009 DEE: 6 years ago- has not had.   Allergies: No Known Allergies   Medical History:  Past Medical History:  Diagnosis Date  .  Hyperlipidemia   . Hypertension   . Thyroid disease   . Vitamin D deficiency    SURGICAL HISTORY He  has a past surgical history that includes Tonsillectomy and adenoidectomy. FAMILY HISTORY His family history includes Cancer in his mother; Heart attack in his father; Rheum arthritis in his mother. SOCIAL HISTORY He  reports that he quit smoking about 12 years ago. He quit smokeless tobacco use about 26 years ago. He reports that he drinks alcohol. He reports that he does not use drugs.   Review of Systems  Constitutional: Negative.   HENT: Negative.   Eyes: Negative.   Respiratory: Negative.   Cardiovascular: Negative.   Gastrointestinal: Negative.   Genitourinary: Negative.        Nocturia x 2, mild BPH symptoms, declines medications  Musculoskeletal: Negative.   Skin: Negative.   Neurological: Negative.   Endo/Heme/Allergies: Negative.   Psychiatric/Behavioral: Negative.      Physical Exam: Estimated body mass index is 34.49 kg/m as calculated from the following:   Height as of this encounter: 6' 1.5" (1.867 m).   Weight as of this encounter: 265 lb (120.2 kg). BP 126/70   Pulse 64   Temp (!) 97.5 F (36.4 C)   Resp 14   Ht 6' 1.5" (1.867 m)   Wt 265 lb (120.2 kg)   SpO2 97%   BMI 34.49 kg/m  General Appearance: Well nourished, in no apparent distress. Eyes: PERRLA, EOMs, conjunctiva no swelling or erythema, normal fundi and vessels. Sinuses: No Frontal/maxillary tenderness ENT/Mouth: Ext aud canals clear, normal light reflex with TMs without erythema, bulging. Good dentition. No erythema, swelling, or exudate on post pharynx. Tonsils not swollen or erythematous. Hearing normal.  Neck: Supple, thyroid normal. No bruits Respiratory: Respiratory effort normal, BS equal bilaterally without rales, rhonchi, wheezing or stridor. Cardio: RRR without murmurs, rubs or gallops. Brisk peripheral pulses without edema.  Chest: symmetric, with normal excursions and  percussion. Abdomen: Soft, +BS. Non tender, no guarding, rebound, hernias, masses, or organomegaly.  Lymphatics: Non tender without lymphadenopathy.  Genitourinary: defer Musculoskeletal: Full ROM all peripheral extremities,5/5 strength, and normal gait. Skin: Warm, dry without rashes, lesions, ecchymosis.  Neuro: Cranial nerves intact, reflexes equal bilaterally. Normal muscle tone, no cerebellar symptoms. Sensation intact.  Psych: Awake and oriented X 3, normal affect, Insight and Judgment appropriate.   EKG: WNL, IRBBB, no ST changes  Vicie Mutters 2:11 PM

## 2016-11-07 NOTE — Patient Instructions (Signed)
Cologuard is an easy to use noninvasive colon cancer screening test based on the latest advances in stool DNA science.   Colon cancer is 3rd most diagnosed cancer and 2nd leading cause of death in both men and women 63 years of age and older despite being one of the most preventable and treatable cancers if found early.  4 of out 5 people diagnosed with colon cancer have NO prior family history.  When caught EARLY 90% of colon cancer is curable.   You have agreed to do a Cologuard screening and have declined a colonoscopy in spite of being explained the risks and benefits of the colonoscopy in detail, including cancer and death. Please understand that this is test not as sensitive or specific as a colonoscopy and you are still recommended to get a colonoscopy.   If you are NOT medicare please call your insurance company and give them these items to see if they will cover it: 1) CPT code, 81528 2) Provider is [facility] 3) [facility] NPI #[id number] 4) [facility] Tax ID #[id number]  Out-of-pocket cost for Cologuard can range from $0 - $649 so please call  You will receive a short call from Cologuard Customer support center at [facility], when you receive a call they will say they are from [facility],  to confirm your mailing address and give you more information.  When they calll you, it will appear on the caller ID as "[facility]" or in some cases only this number will appear, [phone].   [facility] will ship your collection kit directly to you. You will collect a single stool sample in the privacy of your own home, no special preparation required. You will return the kit via UPS pre-paid shipping or pick-up, in the same box it arrived in. Then I will contact you to discuss your results after I receive them from the laboratory.   If you have any questions or concerns, Cologuard Customer Support Specialist are available 24 hours a  day, 7 days a week at [phone] or go to [email or web address].       

## 2016-11-08 ENCOUNTER — Other Ambulatory Visit: Payer: Self-pay | Admitting: Physician Assistant

## 2016-11-08 DIAGNOSIS — Z23 Encounter for immunization: Secondary | ICD-10-CM | POA: Diagnosis not present

## 2016-11-08 DIAGNOSIS — E785 Hyperlipidemia, unspecified: Secondary | ICD-10-CM | POA: Diagnosis not present

## 2016-11-08 DIAGNOSIS — E039 Hypothyroidism, unspecified: Secondary | ICD-10-CM | POA: Diagnosis not present

## 2016-11-08 DIAGNOSIS — Z79899 Other long term (current) drug therapy: Secondary | ICD-10-CM | POA: Diagnosis not present

## 2016-11-09 LAB — TSH: TSH: 0.484 u[IU]/mL (ref 0.450–4.500)

## 2016-11-09 LAB — URINALYSIS, ROUTINE W REFLEX MICROSCOPIC
BILIRUBIN UA: NEGATIVE
GLUCOSE, UA: NEGATIVE
KETONES UA: NEGATIVE
Leukocytes, UA: NEGATIVE
NITRITE UA: NEGATIVE
Protein, UA: NEGATIVE
RBC UA: NEGATIVE
SPEC GRAV UA: 1.019 (ref 1.005–1.030)
UUROB: 0.2 mg/dL (ref 0.2–1.0)
pH, UA: 6.5 (ref 5.0–7.5)

## 2016-11-09 LAB — BASIC METABOLIC PANEL
BUN/Creatinine Ratio: 16 (ref 10–24)
BUN: 16 mg/dL (ref 8–27)
CALCIUM: 9.6 mg/dL (ref 8.6–10.2)
CHLORIDE: 101 mmol/L (ref 96–106)
CO2: 26 mmol/L (ref 20–29)
Creatinine, Ser: 0.97 mg/dL (ref 0.76–1.27)
GFR calc Af Amer: 96 mL/min/{1.73_m2} (ref >59)
GFR, EST NON AFRICAN AMERICAN: 83 mL/min/{1.73_m2} (ref >59)
GLUCOSE: 88 mg/dL (ref 65–99)
POTASSIUM: 4.7 mmol/L (ref 3.5–5.2)
Sodium: 143 mmol/L (ref 134–144)

## 2016-11-09 LAB — LIPID PANEL W/O CHOL/HDL RATIO
Cholesterol, Total: 148 mg/dL (ref 100–199)
HDL: 45 mg/dL (ref >39)
LDL Calculated: 88 mg/dL (ref 0–99)
TRIGLYCERIDES: 73 mg/dL (ref 0–149)
VLDL Cholesterol Cal: 15 mg/dL (ref 5–40)

## 2016-11-09 LAB — CBC WITH DIFFERENTIAL/PLATELET
BASOS ABS: 0.1 10*3/uL (ref 0.0–0.2)
Basos: 1 %
EOS (ABSOLUTE): 0.2 10*3/uL (ref 0.0–0.4)
Eos: 4 %
HEMOGLOBIN: 17.1 g/dL (ref 13.0–17.7)
Hematocrit: 50.2 % (ref 37.5–51.0)
IMMATURE GRANS (ABS): 0 10*3/uL (ref 0.0–0.1)
IMMATURE GRANULOCYTES: 0 %
LYMPHS: 19 %
Lymphocytes Absolute: 1 10*3/uL (ref 0.7–3.1)
MCH: 30.5 pg (ref 26.6–33.0)
MCHC: 34.1 g/dL (ref 31.5–35.7)
MCV: 90 fL (ref 79–97)
MONOCYTES: 11 %
Monocytes Absolute: 0.6 10*3/uL (ref 0.1–0.9)
NEUTROS PCT: 65 %
Neutrophils Absolute: 3.4 10*3/uL (ref 1.4–7.0)
PLATELETS: 199 10*3/uL (ref 150–379)
RBC: 5.6 x10E6/uL (ref 4.14–5.80)
RDW: 13.2 % (ref 12.3–15.4)
WBC: 5.2 10*3/uL (ref 3.4–10.8)

## 2016-11-09 LAB — HEPATIC FUNCTION PANEL
ALK PHOS: 82 IU/L (ref 39–117)
ALT: 26 IU/L (ref 0–44)
AST: 29 IU/L (ref 0–40)
Albumin: 4.4 g/dL (ref 3.6–4.8)
Bilirubin Total: 0.5 mg/dL (ref 0.0–1.2)
Bilirubin, Direct: 0.15 mg/dL (ref 0.00–0.40)
TOTAL PROTEIN: 7 g/dL (ref 6.0–8.5)

## 2016-11-09 LAB — MICROALBUMIN / CREATININE URINE RATIO
CREATININE, UR: 128.3 mg/dL
MICROALBUM., U, RANDOM: 11 ug/mL
Microalb/Creat Ratio: 8.6 mg/g creat (ref 0.0–30.0)

## 2016-11-09 LAB — VITAMIN D 25 HYDROXY (VIT D DEFICIENCY, FRACTURES): VIT D 25 HYDROXY: 73.4 ng/mL (ref 30.0–100.0)

## 2016-11-09 LAB — MAGNESIUM: MAGNESIUM: 1.9 mg/dL (ref 1.6–2.3)

## 2016-11-14 ENCOUNTER — Encounter: Payer: Self-pay | Admitting: Physician Assistant

## 2016-11-26 DIAGNOSIS — Z1212 Encounter for screening for malignant neoplasm of rectum: Secondary | ICD-10-CM | POA: Diagnosis not present

## 2016-11-26 DIAGNOSIS — Z1211 Encounter for screening for malignant neoplasm of colon: Secondary | ICD-10-CM | POA: Diagnosis not present

## 2016-11-26 LAB — COLOGUARD: COLOGUARD: POSITIVE

## 2016-12-07 ENCOUNTER — Other Ambulatory Visit: Payer: Self-pay | Admitting: Physician Assistant

## 2016-12-07 DIAGNOSIS — R195 Other fecal abnormalities: Secondary | ICD-10-CM

## 2016-12-12 ENCOUNTER — Encounter: Payer: Self-pay | Admitting: Internal Medicine

## 2017-01-10 HISTORY — PX: COLONOSCOPY: SHX174

## 2017-01-27 ENCOUNTER — Ambulatory Visit (AMBULATORY_SURGERY_CENTER): Payer: Self-pay | Admitting: *Deleted

## 2017-01-27 ENCOUNTER — Other Ambulatory Visit: Payer: Self-pay

## 2017-01-27 VITALS — Ht 73.0 in | Wt 281.4 lb

## 2017-01-27 DIAGNOSIS — R195 Other fecal abnormalities: Secondary | ICD-10-CM

## 2017-01-27 NOTE — Progress Notes (Signed)
No egg or soy allergy known to patient  No issues with past sedation with any surgeries  or procedures, no intubation problems  No diet pills per patient No home 02 use per patient  No blood thinners per patient  Pt denies issues with constipation  No A fib or A flutter  EMMI video sent to pt's e mail  

## 2017-02-01 ENCOUNTER — Encounter: Payer: Self-pay | Admitting: Internal Medicine

## 2017-02-07 ENCOUNTER — Encounter: Payer: 59 | Admitting: Internal Medicine

## 2017-02-10 ENCOUNTER — Ambulatory Visit (AMBULATORY_SURGERY_CENTER): Payer: 59 | Admitting: Internal Medicine

## 2017-02-10 ENCOUNTER — Other Ambulatory Visit: Payer: Self-pay

## 2017-02-10 ENCOUNTER — Encounter: Payer: Self-pay | Admitting: Internal Medicine

## 2017-02-10 VITALS — BP 126/92 | HR 76 | Temp 98.6°F | Resp 14 | Ht 73.0 in | Wt 281.0 lb

## 2017-02-10 DIAGNOSIS — D123 Benign neoplasm of transverse colon: Secondary | ICD-10-CM

## 2017-02-10 DIAGNOSIS — R195 Other fecal abnormalities: Secondary | ICD-10-CM

## 2017-02-10 MED ORDER — SODIUM CHLORIDE 0.9 % IV SOLN: 500.0000 mL | Freq: Once | INTRAVENOUS | Status: DC

## 2017-02-10 NOTE — Op Note (Signed)
[facility] Patient Name: Julian Lee Procedure Date: 02/10/2017 11:24 AM MRN: [id number] Endoscopist: Gatha Mayer , MD Age: 62 Referring MD:  Date of Birth: 1955-06-12 Gender: Male Account #: [id number] Procedure:                Colonoscopy Indications:              Positive Cologuard test Medicines:                Propofol per Anesthesia, Monitored Anesthesia Care Procedure:                Pre-Anesthesia Assessment:                           - Prior to the procedure, a History and Physical                            was performed, and patient medications and                            allergies were reviewed. The patient's tolerance of                            previous anesthesia was also reviewed. The risks                            and benefits of the procedure and the sedation                            options and risks were discussed with the patient.                            All questions were answered, and informed consent                            was obtained. Prior Anticoagulants: The patient has                            taken no previous anticoagulant or antiplatelet                            agents. ASA Grade Assessment: II - A patient with                            mild systemic disease. After reviewing the risks                            and benefits, the patient was deemed in                            satisfactory condition to undergo the procedure.                           After obtaining informed consent, the colonoscope  was passed under direct vision. Throughout the                            procedure, the patient's blood pressure, pulse, and                            oxygen saturations were monitored continuously. The                            Colonoscope was introduced through the anus and                            advanced to the the cecum, identified by                            appendiceal orifice  and ileocecal valve. The                            colonoscopy was somewhat difficult due to                            significant looping. Successful completion of the                            procedure was aided by using manual pressure. The                            patient tolerated the procedure well. The quality                            of the bowel preparation was adequate. The                            ileocecal valve, appendiceal orifice, and rectum                            were photographed. The bowel preparation used was                            Miralax. Scope In: 11:27:51 AM Scope Out: 11:47:35 AM Scope Withdrawal Time: 0 hours 14 minutes 57 seconds  Total Procedure Duration: 0 hours 19 minutes 44 seconds  Findings:                 The perianal and digital rectal examinations were                            normal. Pertinent negatives include normal prostate                            (size, shape, and consistency).                           Three sessile polyps were found in the transverse  colon. The polyps were diminutive in size. These                            polyps were removed with a cold snare. Resection                            and retrieval were complete. Verification of                            patient identification for the specimen was done.                            Estimated blood loss was minimal.                           Multiple diverticula were found in the sigmoid                            colon.                           The exam was otherwise without abnormality on                            direct and retroflexion views. Complications:            No immediate complications. Estimated Blood Loss:     Estimated blood loss was minimal. Impression:               - Three diminutive polyps in the transverse colon,                            removed with a cold snare. Resected and retrieved.                            - Diverticulosis in the sigmoid colon.                           - The examination was otherwise normal on direct                            and retroflexion views. Recommendation:           - Patient has a contact number available for                            emergencies. The signs and symptoms of potential                            delayed complications were discussed with the                            patient. Return to normal activities tomorrow.                            Written discharge instructions were provided  to the                            patient.                           - Resume previous diet.                           - Continue present medications.                           - Repeat colonoscopy is recommended. The                            colonoscopy date will be determined after pathology                            results from today's exam become available for                            review. Gatha Mayer, MD 02/10/2017 11:54:04 AM This report has been signed electronically.

## 2017-02-10 NOTE — Progress Notes (Signed)
Called to room to assist during endoscopic procedure.  Patient ID and intended procedure confirmed with present staff. Received instructions for my participation in the procedure from the performing physician.  

## 2017-02-10 NOTE — Patient Instructions (Addendum)
I found and removed 3 small polyps - think these caused the Cologuard to be + I will let you know pathology results and when to have another routine colonoscopy by mail and/or My Chart.  You also have a condition called diverticulosis - common and not usually a problem. Please read the handout provided.  I appreciate the opportunity to care for you. Gatha Mayer, MD, FACG  YOU HAD AN ENDOSCOPIC PROCEDURE TODAY AT [facility]:   Refer to the procedure report that was given to you for any specific questions about what was found during the examination.  If the procedure report does not answer your questions, please call your gastroenterologist to clarify.  If you requested that your care partner not be given the details of your procedure findings, then the procedure report has been included in a sealed envelope for you to review at your convenience later.  YOU SHOULD EXPECT: Some feelings of bloating in the abdomen. Passage of more gas than usual.  Walking can help get rid of the air that was put into your GI tract during the procedure and reduce the bloating. If you had a lower endoscopy (such as a colonoscopy or flexible sigmoidoscopy) you may notice spotting of blood in your stool or on the toilet paper. If you underwent a bowel prep for your procedure, you may not have a normal bowel movement for a few days.  Please Note:  You might notice some irritation and congestion in your nose or some drainage.  This is from the oxygen used during your procedure.  There is no need for concern and it should clear up in a day or so.  SYMPTOMS TO REPORT IMMEDIATELY:   Following lower endoscopy (colonoscopy or flexible sigmoidoscopy):  Excessive amounts of blood in the stool  Significant tenderness or worsening of abdominal pains  Swelling of the abdomen that is new, acute  Fever of 100F or higher For urgent or emergent issues, a gastroenterologist can be reached at any hour by  calling [phone].   DIET:  We do recommend a small meal at first, but then you may proceed to your regular diet.  Drink plenty of fluids but you should avoid alcoholic beverages for 24 hours.  ACTIVITY:  You should plan to take it easy for the rest of today and you should NOT DRIVE or use heavy machinery until tomorrow (because of the sedation medicines used during the test).    FOLLOW UP: Our staff will call the number listed on your records the next business day following your procedure to check on you and address any questions or concerns that you may have regarding the information given to you following your procedure. If we do not reach you, we will leave a message.  However, if you are feeling well and you are not experiencing any problems, there is no need to return our call.  We will assume that you have returned to your regular daily activities without incident.  If any biopsies were taken you will be contacted by phone or by letter within the next 1-3 weeks.  Please call us at [phone] if you have not heard about the biopsies in 3 weeks.    SIGNATURES/CONFIDENTIALITY: You and/or your care partner have signed paperwork which will be entered into your electronic medical record.  These signatures attest to the fact that that the information above on your After Visit Summary has been reviewed and is understood.  Full responsibility  of the confidentiality of this discharge information lies with you and/or your care-partner.

## 2017-02-10 NOTE — Progress Notes (Signed)
Pt's states no medical or surgical changes since previsit or office visit.Patient consents to observer being present for procedure.  

## 2017-02-10 NOTE — Progress Notes (Signed)
To recovery, report to RN, VSS. 

## 2017-02-14 ENCOUNTER — Other Ambulatory Visit: Payer: Self-pay | Admitting: Physician Assistant

## 2017-02-14 DIAGNOSIS — I10 Essential (primary) hypertension: Secondary | ICD-10-CM

## 2017-02-16 ENCOUNTER — Encounter: Payer: Self-pay | Admitting: Internal Medicine

## 2017-02-16 DIAGNOSIS — Z86010 Personal history of colonic polyps: Secondary | ICD-10-CM

## 2017-02-16 HISTORY — DX: Personal history of colonic polyps: Z86.010

## 2017-02-16 NOTE — Progress Notes (Signed)
3 adenomas Recall 2022 My Chart letter

## 2017-02-25 ENCOUNTER — Other Ambulatory Visit: Payer: Self-pay | Admitting: Internal Medicine

## 2017-02-25 DIAGNOSIS — E039 Hypothyroidism, unspecified: Secondary | ICD-10-CM

## 2017-05-04 NOTE — Progress Notes (Signed)
Patient ID: Julian Lee, male   DOB: 03/01/1955, 62 y.o.   MRN: [id number]  Assessment and Plan:  Hypertension:  -switch BB to AB, instructions for doxazosin given to patient Call if any issues -Continue medication,  -monitor blood pressure at home.  -Continue DASH diet.   -Reminder to go to the ER if any CP, SOB, nausea, dizziness, severe HA, changes vision/speech, left arm numbness and tingling, and jaw pain.  Cholesterol: -Continue diet and exercise.  -Check cholesterol.  -labs being done at outside facility per patients request.  See fax for results  Pre-diabetes: -Continue diet and exercise.  -Check A1C -see outside labs fax for results  Vitamin D Def: -check level -continue medications.   Hypothyroid -Cont Levothyroxine -tsh level  Morbid Obesity with co morbidities - long discussion about weight loss, diet, and exercise  Continue diet and meds as discussed. Further disposition pending results of labs. [facility]S DONE AT [facility] Future Appointments  Date Time Provider [place]  11/07/2017  2:00 PM Vicie Mutters, PA-C [id number] None    HPI 62 y.o. male  presents for 3 month follow up with hypertension, hyperlipidemia, prediabetes and vitamin D.   His blood pressure has been controlled at home,  today their BP is BP: 124/68.   He does workout, he bikes 15 mins to work and 15 back, then does 30 mins weights 3-4 days a week. He denies chest pain, shortness of breath, dizziness.    He is not on cholesterol medication and denies myalgias. His cholesterol is at goal. The cholesterol last visit was:   Lab Results  Component Value Date   CHOL 148 11/08/2016   HDL 45 11/08/2016   LDLCALC 88 11/08/2016   TRIG 73 11/08/2016   CHOLHDL 2.9 04/07/2014    He has been working on diet and exercise for prediabetes, and denies foot ulcerations, hyperglycemia, hypoglycemia , increased appetite, nausea, paresthesia of the feet, polydipsia, polyuria, visual  disturbances, vomiting and weight loss. Last A1C in the office was:  Lab Results  Component Value Date   HGBA1C 5.4 04/20/2016   Patient is on Vitamin D supplement.  Lab Results  Component Value Date   VD25OH 73.4 11/08/2016     BMI is Body mass index is 38.08 kg/m., he is working on diet and exercise. He will drink diet drinks, 2 a day, he drinks 3 cups of coffee a day, and he does not drink water by itself.  Wt Readings from Last 3 Encounters:  05/08/17 288 lb 9.6 oz (130.9 kg)  02/10/17 281 lb (127.5 kg)  01/27/17 281 lb 6.4 oz (127.6 kg)   He is on thyroid medication. His medication was not changed last visit.   Lab Results  Component Value Date   TSH 0.484 11/08/2016  .   Current Medications:  Current Outpatient Medications on File Prior to Visit  Medication Sig Dispense Refill  . BABY ASPIRIN PO Take 81 mg by mouth daily.    . Cholecalciferol (VITAMIN D PO) Take 2,000 Units by mouth 3 (three) times daily.     Marland Kitchen doxazosin (CARDURA) 4 MG tablet Take 1 tablet (4 mg total) by mouth daily. 30 tablet 11  . Flaxseed, Linseed, (FLAX SEED OIL PO) Take by mouth daily. Takes 2 caps daily    . levothyroxine (SYNTHROID, LEVOTHROID) 175 MCG tablet TAKE 1 TABLET IN THE  MORNING WITH ONLY WATER FOR 30 MINUTES AND NO ANTACID  MEDICATIONS FOR 4 HOURS 90 tablet 0  . losartan-hydrochlorothiazide (  HYZAAR) 100-25 MG tablet TAKE 1 TABLET BY MOUTH  DAILY FOR BLOOD PRESSURE 90 tablet 1   Current Facility-Administered Medications on File Prior to Visit  Medication Dose Route Frequency Provider Last Rate Last Dose  . 0.9 %  sodium chloride infusion  500 mL Intravenous Once Gatha Mayer, MD        Medical History:  Past Medical History:  Diagnosis Date  . Allergy   . Graves disease    pt. had radiation and is now on Thyroid medication   . Hx of adenomatous colonic polyps 02/16/2017  . Hyperlipidemia    not on meds  only LDL in the past  . Hypertension   . Thyroid disease   . Vitamin D  deficiency     Allergies:  Allergies  Allergen Reactions  . Latex      Review of Systems:  Review of Systems  Constitutional: Negative for chills, fever and malaise/fatigue.  HENT: Negative for congestion, ear pain and sore throat.   Eyes: Negative.   Respiratory: Negative for cough, shortness of breath and wheezing.   Cardiovascular: Negative for chest pain, palpitations and leg swelling.  Gastrointestinal: Negative for blood in stool, constipation, diarrhea, heartburn and melena.  Genitourinary: Negative.   Skin: Negative.   Neurological: Negative for dizziness, sensory change and headaches.  Psychiatric/Behavioral: Negative for depression. The patient is not nervous/anxious and does not have insomnia.     Family history- Review and unchanged  Social history- Review and unchanged  Physical Exam: BP 124/68   Pulse 100   Temp (!) 97.4 F (36.3 C)   Resp 12   Ht 6\' 1"  (1.854 m)   Wt 288 lb 9.6 oz (130.9 kg)   SpO2 95%   BMI 38.08 kg/m  Wt Readings from Last 3 Encounters:  05/08/17 288 lb 9.6 oz (130.9 kg)  02/10/17 281 lb (127.5 kg)  01/27/17 281 lb 6.4 oz (127.6 kg)    General Appearance: Well nourished well developed, in no apparent distress. Eyes: PERRLA, EOMs, conjunctiva no swelling or erythema ENT/Mouth: Ear canals normal without obstruction, swelling, erythma, discharge.  TMs normal bilaterally.  Oropharynx moist, clear, without exudate, or postoropharyngeal swelling. Neck: Supple, thyroid normal,no cervical adenopathy  Respiratory: Respiratory effort normal, Breath sounds clear A&P without rhonchi, wheeze, or rale.  No retractions, no accessory usage. Cardio: RRR with no MRGs. Brisk peripheral pulses without edema.  Abdomen: Soft, + BS,  Non tender, no guarding, rebound, hernias, masses. Musculoskeletal: Full ROM, 5/5 strength, Normal gait Skin: Warm, dry without rashes, lesions, ecchymosis.  Neuro: Awake and oriented X 3, Cranial nerves intact. Normal  muscle tone, no cerebellar symptoms. Psych: Normal affect, Insight and Judgment appropriate.    Vicie Mutters, PA-C 9:45 AM [facility]

## 2017-05-08 ENCOUNTER — Encounter: Payer: Self-pay | Admitting: Physician Assistant

## 2017-05-08 ENCOUNTER — Ambulatory Visit: Payer: 59 | Admitting: Physician Assistant

## 2017-05-08 VITALS — BP 124/68 | HR 100 | Temp 97.4°F | Resp 12 | Ht 73.0 in | Wt 288.6 lb

## 2017-05-08 DIAGNOSIS — E039 Hypothyroidism, unspecified: Secondary | ICD-10-CM

## 2017-05-08 DIAGNOSIS — Z79899 Other long term (current) drug therapy: Secondary | ICD-10-CM

## 2017-05-08 DIAGNOSIS — E785 Hyperlipidemia, unspecified: Secondary | ICD-10-CM

## 2017-05-08 DIAGNOSIS — I10 Essential (primary) hypertension: Secondary | ICD-10-CM | POA: Diagnosis not present

## 2017-05-08 DIAGNOSIS — R7309 Other abnormal glucose: Secondary | ICD-10-CM

## 2017-05-08 MED ORDER — DOXAZOSIN MESYLATE 4 MG PO TABS: 4.0000 mg | ORAL_TABLET | Freq: Every day | ORAL | 3 refills | Status: DC

## 2017-05-08 MED ORDER — LEVOTHYROXINE SODIUM 175 MCG PO TABS: ORAL_TABLET | ORAL | 3 refills | Status: DC

## 2017-05-08 NOTE — Patient Instructions (Signed)
Being dehydrated can hurt your kidneys, cause fatigue, headaches, muscle aches, joint pain, and dry skin/nails so please increase your fluids.   Drink 80-100 oz a day of water, measure it out! Eat 3 meals a day, have to do breakfast, eat protein- hard boiled eggs, protein bar like nature valley protein bar, greek yogurt like oikos triple zero, chobani 100, or light n fit greek  Can check out plantnanny app on your phone to help you keep track of your water  Try to switch pasta for salad  Diet soda leads to weight gain.  We recently discovered that the artifical sugar in the soda stops an enzyme in your stomach that is suppose to signal that your brain is full. So patients that drink a lot of diet soda will never feel full and tend to over eat. So please cut back on diet soda and it can help with your weight loss.   Intermittent fasting is more about strategy than starvation. It's meant to reset your body in different ways, hopefully with fitness and nutrition changes as a result.  Like any big switchover, though, results may vary when it comes down to the individual level. What works for your friends may not work for you, or vice versa. That's why it's helpful to play around with variations on intermittent fasting and healthy habits and find what works best for you.  WHAT IS INTERMITTENT FASTING AND WHY DO IT?  Intermittent fasting doesn't involve specific foods, but rather, a strict schedule regarding when you eat. Also called "time-restricted eating," the tactic has been praised for its contribution to weight loss, improved body composition, and decreased cravings. Preliminary research also suggests it may be beneficial for glucose tolerance, hormone regulation, better muscle mass and lower body fat.  Part of its appeal is the simplicity of the effort. Unlike some other trends, there's no calculations to intermittent fasting.  You simply eat within a certain block of time, usually a window of  8-10 hours. In the other big block of time - about 14-16 hours, including when you're asleep - you don't eat anything, not even snacks. You can drink water, coffee, tea or any other beverage that doesn't have calories.  For example, if you like having a late dinner, you might skip breakfast and have your first meal at noon and your last meal of the day at 8 p.m., and then not eat until noon again the next day.  IDEAS FOR GETTING STARTED  If you're new to the strategy, it may be helpful to eat within the typical circadian rhythm and keep eating within daylight hours. This can be especially beneficial if you're looking at intermittent fasting for weight-loss goals.  So first try only eating between 12pm to 8pm.  Outside of this time you may have water, black coffee, and hot tea. You may not eat it drink anything that has carbs, sugars, OR artificial sugars like diet soda.   Like any major eating and fitness shift, it can take time to find the perfect fit, so don't be afraid to experiment with different options - including ditching intermittent fasting altogether if it's simply not for you. But if it is, you may be surprised by some of the benefits that come along with the strategy.  Veggies are great because you can eat a ton! They are low in calories, great to fill you up, and have a ton of vitamins, minerals, and protein.

## 2017-05-11 ENCOUNTER — Other Ambulatory Visit: Payer: Self-pay | Admitting: Physician Assistant

## 2017-05-11 DIAGNOSIS — E039 Hypothyroidism, unspecified: Secondary | ICD-10-CM | POA: Diagnosis not present

## 2017-05-11 DIAGNOSIS — I10 Essential (primary) hypertension: Secondary | ICD-10-CM | POA: Diagnosis not present

## 2017-05-11 DIAGNOSIS — R739 Hyperglycemia, unspecified: Secondary | ICD-10-CM | POA: Diagnosis not present

## 2017-05-11 DIAGNOSIS — E785 Hyperlipidemia, unspecified: Secondary | ICD-10-CM | POA: Diagnosis not present

## 2017-05-12 LAB — BASIC METABOLIC PANEL
BUN/Creatinine Ratio: 17 (ref 10–24)
BUN: 16 mg/dL (ref 8–27)
CO2: 24 mmol/L (ref 20–29)
CREATININE: 0.95 mg/dL (ref 0.76–1.27)
Calcium: 9.5 mg/dL (ref 8.6–10.2)
Chloride: 102 mmol/L (ref 96–106)
GFR, EST AFRICAN AMERICAN: 98 mL/min/{1.73_m2} (ref >59)
GFR, EST NON AFRICAN AMERICAN: 85 mL/min/{1.73_m2} (ref >59)
Glucose: 88 mg/dL (ref 65–99)
POTASSIUM: 4.2 mmol/L (ref 3.5–5.2)
Sodium: 139 mmol/L (ref 134–144)

## 2017-05-12 LAB — CBC WITH DIFFERENTIAL/PLATELET
BASOS: 1 %
Basophils Absolute: 0.1 10*3/uL (ref 0.0–0.2)
EOS (ABSOLUTE): 0.2 10*3/uL (ref 0.0–0.4)
EOS: 3 %
HEMATOCRIT: 47.3 % (ref 37.5–51.0)
HEMOGLOBIN: 16.5 g/dL (ref 13.0–17.7)
IMMATURE GRANULOCYTES: 0 %
Immature Grans (Abs): 0 10*3/uL (ref 0.0–0.1)
Lymphocytes Absolute: 1.1 10*3/uL (ref 0.7–3.1)
Lymphs: 22 %
MCH: 31 pg (ref 26.6–33.0)
MCHC: 34.9 g/dL (ref 31.5–35.7)
MCV: 89 fL (ref 79–97)
MONOCYTES: 11 %
MONOS ABS: 0.6 10*3/uL (ref 0.1–0.9)
Neutrophils Absolute: 3 10*3/uL (ref 1.4–7.0)
Neutrophils: 63 %
Platelets: 199 10*3/uL (ref 150–379)
RBC: 5.32 x10E6/uL (ref 4.14–5.80)
RDW: 13.2 % (ref 12.3–15.4)
WBC: 4.9 10*3/uL (ref 3.4–10.8)

## 2017-05-12 LAB — HEPATIC FUNCTION PANEL
ALT: 29 IU/L (ref 0–44)
AST: 26 IU/L (ref 0–40)
Albumin: 4.3 g/dL (ref 3.6–4.8)
Alkaline Phosphatase: 80 IU/L (ref 39–117)
Bilirubin Total: 0.5 mg/dL (ref 0.0–1.2)
Bilirubin, Direct: 0.17 mg/dL (ref 0.00–0.40)
TOTAL PROTEIN: 6.7 g/dL (ref 6.0–8.5)

## 2017-05-12 LAB — LIPID PANEL W/O CHOL/HDL RATIO
Cholesterol, Total: 151 mg/dL (ref 100–199)
HDL: 44 mg/dL (ref >39)
LDL CALC: 89 mg/dL (ref 0–99)
Triglycerides: 89 mg/dL (ref 0–149)
VLDL CHOLESTEROL CAL: 18 mg/dL (ref 5–40)

## 2017-05-12 LAB — SPECIMEN STATUS REPORT

## 2017-05-12 LAB — HGB A1C W/O EAG: HEMOGLOBIN A1C: 5.4 % (ref 4.8–5.6)

## 2017-05-12 LAB — TSH: TSH: 0.575 u[IU]/mL (ref 0.450–4.500)

## 2017-05-14 ENCOUNTER — Other Ambulatory Visit: Payer: Self-pay | Admitting: Internal Medicine

## 2017-05-14 DIAGNOSIS — I10 Essential (primary) hypertension: Secondary | ICD-10-CM

## 2017-11-06 NOTE — Progress Notes (Signed)
Complete Physical  Assessment and Plan: LABS DONE [facility]  Essential hypertension - continue medications, DASH diet, exercise and monitor at home. Call if greater than 130/80.   Hypothyroidism, unspecified type Hypothyroidism-check TSH level, continue medications the same, reminded to take on an empty stomach 30-54mins before food.   Hyperlipidemia, unspecified hyperlipidemia type check lipids decrease fatty foods increase activity.   Abnormal glucose Discussed disease progression and risks Discussed diet/exercise, weight management and risk modification  Class 2 severe obesity due to excess calories with serious comorbidity and body mass index (BMI) of 37.0 to 37.9 in adult [facility]) - follow up 3 months for progress monitoring - increase veggies, decrease carbs - long discussion about weight loss, diet, and exercise  Medication management  Vitamin D deficiency  Hx of adenomatous colonic polyps Recall 2022  Encounter for general adult medical examination with abnormal findings 1 year  Needs flu shot -     FLU VACCINE MDCK QUAD W/Preservative  BPH associated with nocturia Continue meds, check PSA  Other orders -     triamcinolone ointment (KENALOG) 0.1 %; Apply 1 application topically 2 (two) times daily.  [facility]S DONE AT [facility] Discussed med's effects and SE's. Screening labs and tests as requested with regular follow-up as recommended.   HPI He has had elevated blood pressure for 15 + years. His blood pressure is not checked at home, he is on losartan 100mg  daily, last visit a HCTZ 25mg , today their BP is BP: 136/80  Lab Results  Component Value Date   GFRNONAA 85 05/11/2017   He does workout, does cardio 30 mins 5 x a week.  He denies chest pain, shortness of breath, dizziness.  He has history of eczema on his hands, uses triamcinolone.  He is not on cholesterol medication and denies myalgias. His cholesterol is at goal. The cholesterol last visit was:   Lab  Results  Component Value Date   CHOL 151 05/11/2017   HDL 44 05/11/2017   LDLCALC 89 05/11/2017   TRIG 89 05/11/2017   CHOLHDL 2.9 04/07/2014    Last A1C in the office was:  Lab Results  Component Value Date   HGBA1C 5.4 05/11/2017   Patient is on Vitamin D supplement.   Lab Results  Component Value Date   VD25OH 73.4 11/08/2016     He is on thyroid medication. His medication was not changed last visit. Patient denies nervousness, palpitations and weight changes.  Lab Results  Component Value Date   TSH 0.575 05/11/2017   BMI is Body mass index is 39.08 kg/m., he is working on diet and exercise. Wt Readings from Last 3 Encounters:  11/07/17 296 lb 3.2 oz (134.4 kg)  05/08/17 288 lb 9.6 oz (130.9 kg)  02/10/17 281 lb (127.5 kg)   Lab Results  Component Value Date   PSA 0.70 10/01/2013     Current Medications:  Current Outpatient Medications on File Prior to Visit  Medication Sig Dispense Refill  . BABY ASPIRIN PO Take 81 mg by mouth daily.    . Cholecalciferol (VITAMIN D PO) Take 2,000 Units by mouth 3 (three) times daily.     Marland Kitchen doxazosin (CARDURA) 4 MG tablet Take 1 tablet (4 mg total) by mouth daily. 90 tablet 3  . Flaxseed, Linseed, (FLAX SEED OIL PO) Take by mouth daily. Takes 2 caps daily    . levothyroxine (SYNTHROID, LEVOTHROID) 175 MCG tablet TAKE 1 TABLET IN THE  MORNING WITH ONLY WATER FOR 30 MINUTES  AND NO ANTACID  MEDICATIONS FOR 4 HOURS 90 tablet 3  . losartan-hydrochlorothiazide (HYZAAR) 100-25 MG tablet TAKE 1 TABLET BY MOUTH  DAILY FOR BLOOD PRESSURE 90 tablet 1   Current Facility-Administered Medications on File Prior to Visit  Medication Dose Route Frequency Provider Last Rate Last Dose  . 0.9 %  sodium chloride infusion  500 mL Intravenous Once Gatha Mayer, MD       Health Maintenance:  Immunization History  Administered Date(s) Administered  . Influenza Inj Mdck Quad With Preservative 11/08/2016, 11/07/2017  . Influenza Split 09/26/2012  .  PPD Test 04/01/2013  . Pneumococcal Polysaccharide-23 09/16/2008  . Td 08/22/2006  . Tdap 11/08/2016  . Zoster 01/10/2006   Tetanus: 2018 Pneumovax: 2010 Prevnar 13: due age 1 Flu vaccine: 2019 TODAY Zostavax: 2008  DEXA: CXR 2016 Colonoscopy:2019 recall 2022 3 adenomas, Dr. Carlean Purl Cardiolite negative 2009 DEE: 6 years ago- has not had.   Allergies:  Allergies  Allergen Reactions  . Latex      Medical History:  Past Medical History:  Diagnosis Date  . Allergy   . Graves disease    pt. had radiation and is now on Thyroid medication   . Hx of adenomatous colonic polyps 02/16/2017  . Hyperlipidemia    not on meds  only LDL in the past  . Hypertension   . Thyroid disease   . Vitamin D deficiency    SURGICAL HISTORY He  has a past surgical history that includes Tonsillectomy and adenoidectomy. FAMILY HISTORY His family history includes Cancer in his mother; Heart attack in his father; Rheum arthritis in his mother. SOCIAL HISTORY He  reports that he quit smoking about 13 years ago. He quit smokeless tobacco use about 27 years ago. He reports that he drinks alcohol. He reports that he does not use drugs.   Review of Systems  Constitutional: Negative.   HENT: Negative.   Eyes: Negative.   Respiratory: Negative.   Cardiovascular: Negative.   Gastrointestinal: Negative.   Genitourinary: Negative.        Nocturia x 2, mild BPH symptoms, declines medications  Musculoskeletal: Negative.   Skin: Negative.   Neurological: Negative.   Endo/Heme/Allergies: Negative.   Psychiatric/Behavioral: Negative.      Physical Exam: Estimated body mass index is 39.08 kg/m as calculated from the following:   Height as of this encounter: 6\' 1"  (1.854 m).   Weight as of this encounter: 296 lb 3.2 oz (134.4 kg). BP 136/80   Pulse 76   Temp 98.1 F (36.7 C)   Resp 16   Ht 6\' 1"  (1.854 m)   Wt 296 lb 3.2 oz (134.4 kg)   SpO2 96%   BMI 39.08 kg/m  General Appearance: Well  nourished, in no apparent distress. Eyes: PERRLA, EOMs, conjunctiva no swelling or erythema, normal fundi and vessels. Sinuses: No Frontal/maxillary tenderness ENT/Mouth: Ext aud canals clear, normal light reflex with TMs without erythema, bulging. Good dentition. No erythema, swelling, or exudate on post pharynx. Tonsils not swollen or erythematous. Hearing normal.  Neck: Supple, thyroid normal. No bruits Respiratory: Respiratory effort normal, BS equal bilaterally without rales, rhonchi, wheezing or stridor. Cardio: RRR without murmurs, rubs or gallops. Brisk peripheral pulses without edema.  Chest: symmetric, with normal excursions and percussion. Abdomen: Soft, +BS. Non tender, no guarding, rebound, hernias, masses, or organomegaly.  Lymphatics: Non tender without lymphadenopathy.  Genitourinary: defer Musculoskeletal: Full ROM all peripheral extremities,5/5 strength, and normal gait. Skin: Warm, dry without rashes, lesions, ecchymosis.  Neuro: Cranial nerves intact, reflexes equal bilaterally. Normal muscle tone, no cerebellar symptoms. Sensation intact.  Psych: Awake and oriented X 3, normal affect, Insight and Judgment appropriate.   EKG: WNL, IRBBB, no ST changes  Vicie Mutters 9:03 AM

## 2017-11-07 ENCOUNTER — Encounter: Payer: Self-pay | Admitting: Physician Assistant

## 2017-11-07 ENCOUNTER — Ambulatory Visit: Payer: 59 | Admitting: Physician Assistant

## 2017-11-07 VITALS — BP 136/80 | HR 76 | Temp 98.1°F | Resp 16 | Ht 73.0 in | Wt 296.2 lb

## 2017-11-07 DIAGNOSIS — E785 Hyperlipidemia, unspecified: Secondary | ICD-10-CM

## 2017-11-07 DIAGNOSIS — E039 Hypothyroidism, unspecified: Secondary | ICD-10-CM

## 2017-11-07 DIAGNOSIS — Z23 Encounter for immunization: Secondary | ICD-10-CM

## 2017-11-07 DIAGNOSIS — Z0001 Encounter for general adult medical examination with abnormal findings: Secondary | ICD-10-CM

## 2017-11-07 DIAGNOSIS — I10 Essential (primary) hypertension: Secondary | ICD-10-CM

## 2017-11-07 DIAGNOSIS — Z0000 Encounter for general adult medical examination without abnormal findings: Secondary | ICD-10-CM

## 2017-11-07 DIAGNOSIS — Z136 Encounter for screening for cardiovascular disorders: Secondary | ICD-10-CM

## 2017-11-07 DIAGNOSIS — R7309 Other abnormal glucose: Secondary | ICD-10-CM

## 2017-11-07 DIAGNOSIS — Z86010 Personal history of colonic polyps: Secondary | ICD-10-CM

## 2017-11-07 DIAGNOSIS — Z860101 Personal history of adenomatous and serrated colon polyps: Secondary | ICD-10-CM

## 2017-11-07 DIAGNOSIS — R351 Nocturia: Secondary | ICD-10-CM

## 2017-11-07 DIAGNOSIS — N401 Enlarged prostate with lower urinary tract symptoms: Secondary | ICD-10-CM

## 2017-11-07 DIAGNOSIS — Z6837 Body mass index (BMI) 37.0-37.9, adult: Secondary | ICD-10-CM

## 2017-11-07 DIAGNOSIS — E559 Vitamin D deficiency, unspecified: Secondary | ICD-10-CM

## 2017-11-07 DIAGNOSIS — E66812 Obesity, class 2: Secondary | ICD-10-CM

## 2017-11-07 DIAGNOSIS — Z79899 Other long term (current) drug therapy: Secondary | ICD-10-CM

## 2017-11-07 MED ORDER — TRIAMCINOLONE ACETONIDE 0.1 % EX OINT: 1.0000 "application " | TOPICAL_OINTMENT | Freq: Two times a day (BID) | CUTANEOUS | 1 refills | Status: DC

## 2017-11-07 NOTE — Patient Instructions (Addendum)
Do the triamcinolone that is given Can wrap that finger at night with the cream on it If it is not better, may need an antibiotic  Diet soda leads to weight gain.  We recently discovered that the artifical sugar in the soda stops an enzyme in your stomach that is suppose to signal that your brain is full. So patients that drink a lot of diet soda will never feel full and tend to over eat. So please cut back on diet soda and it can help with your weight loss.   A study found that two or more sodas a day increased the risk of hypertension, diabetes, stroke.   Another recent study found that higher consumption of soda and juice was associated with increased risk of cancer and breast cancer.   In animal studies artifical sweeteners affect the gut bacteria and may contribute to chronic disease this way.      When it comes to diets, agreement about the perfect plan isn't easy to find, even among the experts. Experts at the [employer] developed an idea known as the Healthy Eating Plate. Just imagine a plate divided into logical, healthy portions.  The emphasis is on diet quality:  Load up on vegetables and fruits - one-half of your plate: Aim for color and variety, and remember that potatoes don't count.  Go for whole grains - one-quarter of your plate: Whole wheat, barley, wheat berries, quinoa, oats, brown rice, and foods made with them. If you want pasta, go with whole wheat pasta.  Protein power - one-quarter of your plate: Fish, chicken, beans, and nuts are all healthy, versatile protein sources. Limit red meat.  The diet, however, does go beyond the plate, offering a few other suggestions.  Use healthy plant oils, such as olive, canola, soy, corn, sunflower and peanut. Check the labels, and avoid partially hydrogenated oil, which have unhealthy trans fats.  If you're thirsty, drink water. Coffee and tea are good in moderation, but skip sugary drinks and limit milk  and dairy products to one or two daily servings.  The type of carbohydrate in the diet is more important than the amount. Some sources of carbohydrates, such as vegetables, fruits, whole grains, and beans-are healthier than others.  Finally, stay active.

## 2017-11-09 ENCOUNTER — Other Ambulatory Visit: Payer: Self-pay | Admitting: Physician Assistant

## 2017-11-09 DIAGNOSIS — E039 Hypothyroidism, unspecified: Secondary | ICD-10-CM | POA: Diagnosis not present

## 2017-11-09 DIAGNOSIS — E785 Hyperlipidemia, unspecified: Secondary | ICD-10-CM | POA: Diagnosis not present

## 2017-11-09 DIAGNOSIS — I10 Essential (primary) hypertension: Secondary | ICD-10-CM | POA: Diagnosis not present

## 2017-11-10 LAB — MICROSCOPIC EXAMINATION
Bacteria, UA: NONE SEEN
Casts: NONE SEEN /lpf
EPITHELIAL CELLS (NON RENAL): NONE SEEN /HPF (ref 0–10)
WBC, UA: NONE SEEN /hpf (ref 0–5)

## 2017-11-10 LAB — SPECIMEN STATUS REPORT

## 2017-11-10 LAB — CBC WITH DIFFERENTIAL/PLATELET
Basophils Absolute: 0.1 10*3/uL (ref 0.0–0.2)
Basos: 2 %
EOS (ABSOLUTE): 0.2 10*3/uL (ref 0.0–0.4)
Eos: 4 %
Hematocrit: 49.5 % (ref 37.5–51.0)
Hemoglobin: 16.2 g/dL (ref 13.0–17.7)
Immature Grans (Abs): 0 10*3/uL (ref 0.0–0.1)
Immature Granulocytes: 0 %
Lymphocytes Absolute: 1 10*3/uL (ref 0.7–3.1)
Lymphs: 20 %
MCH: 29.8 pg (ref 26.6–33.0)
MCHC: 32.7 g/dL (ref 31.5–35.7)
MCV: 91 fL (ref 79–97)
Monocytes Absolute: 0.7 10*3/uL (ref 0.1–0.9)
Monocytes: 13 %
Neutrophils Absolute: 3 10*3/uL (ref 1.4–7.0)
Neutrophils: 61 %
Platelets: 203 10*3/uL (ref 150–450)
RBC: 5.44 x10E6/uL (ref 4.14–5.80)
RDW: 12.4 % (ref 12.3–15.4)
WBC: 5 10*3/uL (ref 3.4–10.8)

## 2017-11-10 LAB — URINALYSIS, COMPLETE
Bilirubin, UA: NEGATIVE
Glucose, UA: NEGATIVE
Ketones, UA: NEGATIVE
Leukocytes, UA: NEGATIVE
Nitrite, UA: NEGATIVE
Protein, UA: NEGATIVE
RBC, UA: NEGATIVE
SPEC GRAV UA: 1.006 (ref 1.005–1.030)
Urobilinogen, Ur: 0.2 mg/dL (ref 0.2–1.0)
pH, UA: 7 (ref 5.0–7.5)

## 2017-11-10 LAB — BASIC METABOLIC PANEL
BUN / CREAT RATIO: 15 (ref 10–24)
BUN: 16 mg/dL (ref 8–27)
CO2: 23 mmol/L (ref 20–29)
CREATININE: 1.08 mg/dL (ref 0.76–1.27)
Calcium: 9.4 mg/dL (ref 8.6–10.2)
Chloride: 103 mmol/L (ref 96–106)
GFR calc Af Amer: 83 mL/min/{1.73_m2} (ref >59)
GFR, EST NON AFRICAN AMERICAN: 72 mL/min/{1.73_m2} (ref >59)
Glucose: 89 mg/dL (ref 65–99)
Potassium: 4.3 mmol/L (ref 3.5–5.2)
SODIUM: 140 mmol/L (ref 134–144)

## 2017-11-10 LAB — TSH: TSH: 0.609 u[IU]/mL (ref 0.450–4.500)

## 2017-11-10 LAB — HGB A1C W/O EAG: HEMOGLOBIN A1C: 5.2 % (ref 4.8–5.6)

## 2017-11-10 LAB — LIPID PANEL W/O CHOL/HDL RATIO
Cholesterol, Total: 177 mg/dL (ref 100–199)
HDL: 42 mg/dL (ref >39)
LDL CALC: 112 mg/dL — AB (ref 0–99)
Triglycerides: 115 mg/dL (ref 0–149)
VLDL Cholesterol Cal: 23 mg/dL (ref 5–40)

## 2017-11-10 LAB — HEPATIC FUNCTION PANEL
ALT: 30 IU/L (ref 0–44)
AST: 28 IU/L (ref 0–40)
Albumin: 4.4 g/dL (ref 3.6–4.8)
Alkaline Phosphatase: 84 IU/L (ref 39–117)
BILIRUBIN TOTAL: 0.5 mg/dL (ref 0.0–1.2)
BILIRUBIN, DIRECT: 0.13 mg/dL (ref 0.00–0.40)
TOTAL PROTEIN: 6.9 g/dL (ref 6.0–8.5)

## 2017-11-10 LAB — MAGNESIUM: MAGNESIUM: 2 mg/dL (ref 1.6–2.3)

## 2017-11-10 LAB — MICROALBUMIN / CREATININE URINE RATIO
CREATININE, UR: 25.2 mg/dL
Microalb/Creat Ratio: <11.9 mg/g creat (ref 0.0–30.0)
Microalbumin, Urine: <3 ug/mL

## 2017-11-10 LAB — PSA: Prostate Specific Ag, Serum: 0.9 ng/mL (ref 0.0–4.0)

## 2017-11-10 LAB — VITAMIN D 25 HYDROXY (VIT D DEFICIENCY, FRACTURES): VIT D 25 HYDROXY: 66.9 ng/mL (ref 30.0–100.0)

## 2017-11-22 NOTE — Addendum Note (Signed)
Addended by: Vicie Mutters R on: 11/22/2017 09:29 AM   Modules accepted: Orders

## 2017-12-01 DIAGNOSIS — I10 Essential (primary) hypertension: Secondary | ICD-10-CM

## 2017-12-03 MED ORDER — VALSARTAN-HYDROCHLOROTHIAZIDE 320-25 MG PO TABS: ORAL_TABLET | ORAL | 0 refills | Status: DC

## 2017-12-11 MED ORDER — TELMISARTAN-HCTZ 80-25 MG PO TABS: ORAL_TABLET | ORAL | 0 refills | Status: DC

## 2017-12-12 ENCOUNTER — Other Ambulatory Visit: Payer: Self-pay | Admitting: Physician Assistant

## 2017-12-12 MED ORDER — OLMESARTAN MEDOXOMIL 40 MG PO TABS: 40.0000 mg | ORAL_TABLET | Freq: Every day | ORAL | 1 refills | Status: DC

## 2017-12-12 MED ORDER — HYDROCHLOROTHIAZIDE 25 MG PO TABS: 25.0000 mg | ORAL_TABLET | Freq: Every day | ORAL | 3 refills | Status: DC

## 2018-03-14 ENCOUNTER — Other Ambulatory Visit: Payer: Self-pay | Admitting: Physician Assistant

## 2018-03-14 DIAGNOSIS — E039 Hypothyroidism, unspecified: Secondary | ICD-10-CM

## 2018-04-30 ENCOUNTER — Other Ambulatory Visit: Payer: Self-pay | Admitting: Physician Assistant

## 2018-05-02 ENCOUNTER — Other Ambulatory Visit: Payer: Self-pay | Admitting: Physician Assistant

## 2018-05-02 DIAGNOSIS — I10 Essential (primary) hypertension: Secondary | ICD-10-CM

## 2018-05-11 NOTE — Progress Notes (Signed)
Patient ID: Julian Lee, male   DOB: 12-14-1955, 63 y.o.   MRN: [id number]  Assessment and Plan:  Hypertension:  Switch to benicar/HCTZ one pill -Continue medication,  -monitor blood pressure at home.  -Continue DASH diet.   -Reminder to go to the ER if any CP, SOB, nausea, dizziness, severe HA, changes vision/speech, left arm numbness and tingling, and jaw pain.  Cholesterol: -Continue diet and exercise.  -Check cholesterol.  -labs being done at outside facility per patients request.  See fax for results  Pre-diabetes: -Continue diet and exercise.  -Check A1C -see outside labs fax for results  Vitamin D Def: -check level -continue medications.   Hypothyroid -Cont Levothyroxine -tsh level - make sure not on biotin  Morbid Obesity with co morbidities - long discussion about weight loss, diet, and exercise Check out mini habits Decrease portion sizes Continue to workout  Continue diet and meds as discussed. Further disposition pending results of labs. [facility]S DONE AT [facility] Future Appointments  Date Time Provider [place]  11/12/2018  2:00 PM Vicie Mutters, PA-C [id number] None    HPI 63 y.o. male  presents for 3 month follow up with hypertension, hyperlipidemia, prediabetes and vitamin D.   His blood pressure has been controlled at home,  today their BP is BP: 116/68.   He does workout, he bikes 15 mins to work and 15 back, then does 30 mins weights 3-4 days a week. He denies chest pain, shortness of breath, dizziness.   BMI is Body mass index is 39.36 kg/m., he is working on diet and exercise. He is drinking more water, has cut back on the diet drinks.  Wt Readings from Last 3 Encounters:  05/14/18 298 lb 4.8 oz (135.3 kg)  11/07/17 296 lb 3.2 oz (134.4 kg)  05/08/17 288 lb 9.6 oz (130.9 kg)    He is not on cholesterol medication and denies myalgias. His cholesterol is at goal. The cholesterol last visit was:   Lab Results  Component Value Date    CHOL 177 11/09/2017   HDL 42 11/09/2017   LDLCALC 112 (H) 11/09/2017   TRIG 115 11/09/2017   CHOLHDL 2.9 04/07/2014    He has been working on diet and exercise for prediabetes, and denies foot ulcerations, hyperglycemia, hypoglycemia , increased appetite, nausea, paresthesia of the feet, polydipsia, polyuria, visual disturbances, vomiting and weight loss. Last A1C in the office was:  Lab Results  Component Value Date   HGBA1C 5.2 11/09/2017   Patient is on Vitamin D supplement.  Lab Results  Component Value Date   VD25OH 66.9 11/09/2017     He is on thyroid medication. His medication was not changed last visit.   Lab Results  Component Value Date   TSH 0.609 11/09/2017  .   Current Medications:  Current Outpatient Medications on File Prior to Visit  Medication Sig Dispense Refill  . BABY ASPIRIN PO Take 81 mg by mouth daily.    . Cholecalciferol (VITAMIN D PO) Take 2,000 Units by mouth 3 (three) times daily.     Marland Kitchen doxazosin (CARDURA) 4 MG tablet TAKE 1 TABLET BY MOUTH  DAILY 90 tablet 3  . Flaxseed, Linseed, (FLAX SEED OIL PO) Take by mouth daily. Takes 2 caps daily    . hydrochlorothiazide (HYDRODIURIL) 25 MG tablet Take 1 tablet (25 mg total) by mouth daily. 90 tablet 3  . levothyroxine (SYNTHROID, LEVOTHROID) 175 MCG tablet TAKE 1 TABLET BY MOUTH IN  THE MORNING WITH WATER ONLY  FOR 30 MINUTES AND NO  ANTACID MEDICATIONS FOR 4  HOURS AS DIRECTED 90 tablet 3  . olmesartan (BENICAR) 40 MG tablet TAKE 1 TABLET BY MOUTH  DAILY 90 tablet 1  . triamcinolone ointment (KENALOG) 0.1 % Apply 1 application topically 2 (two) times daily. 80 g 1   Current Facility-Administered Medications on File Prior to Visit  Medication Dose Route Frequency Provider Last Rate Last Dose  . 0.9 %  sodium chloride infusion  500 mL Intravenous Once Gatha Mayer, MD        Medical History:  Past Medical History:  Diagnosis Date  . Allergy   . Graves disease    pt. had radiation and is now on Thyroid  medication   . Hx of adenomatous colonic polyps 02/16/2017  . Hyperlipidemia    not on meds  only LDL in the past  . Hypertension   . Thyroid disease   . Vitamin D deficiency     Allergies:  Allergies  Allergen Reactions  . Latex      Review of Systems:  Review of Systems  Constitutional: Negative for chills, fever and malaise/fatigue.  HENT: Negative for congestion, ear pain and sore throat.   Eyes: Negative.   Respiratory: Negative for cough, shortness of breath and wheezing.   Cardiovascular: Negative for chest pain, palpitations and leg swelling.  Gastrointestinal: Negative for blood in stool, constipation, diarrhea, heartburn and melena.  Genitourinary: Negative.   Skin: Negative.   Neurological: Negative for dizziness, sensory change and headaches.  Psychiatric/Behavioral: Negative for depression. The patient is not nervous/anxious and does not have insomnia.     Family history- Review and unchanged  Social history- Review and unchanged  Physical Exam: BP 116/68   Pulse 62   Temp 97.9 F (36.6 C)   Ht 6\' 1"  (1.854 m)   Wt 298 lb 4.8 oz (135.3 kg)   SpO2 96%   BMI 39.36 kg/m  Wt Readings from Last 3 Encounters:  05/14/18 298 lb 4.8 oz (135.3 kg)  11/07/17 296 lb 3.2 oz (134.4 kg)  05/08/17 288 lb 9.6 oz (130.9 kg)    General Appearance: Well nourished well developed, in no apparent distress. Eyes: PERRLA, EOMs, conjunctiva no swelling or erythema ENT/Mouth: Ear canals normal without obstruction, swelling, erythma, discharge.  TMs normal bilaterally.  Oropharynx moist, clear, without exudate, or postoropharyngeal swelling. Neck: Supple, thyroid normal,no cervical adenopathy  Respiratory: Respiratory effort normal, Breath sounds clear A&P without rhonchi, wheeze, or rale.  No retractions, no accessory usage. Cardio: RRR with no MRGs. Brisk peripheral pulses without edema.  Abdomen: Soft, + BS,  Non tender, no guarding, rebound, hernias,  masses. Musculoskeletal: Full ROM, 5/5 strength, Normal gait Skin: Warm, dry without rashes, lesions, ecchymosis.  Neuro: Awake and oriented X 3, Cranial nerves intact. Normal muscle tone, no cerebellar symptoms. Psych: Normal affect, Insight and Judgment appropriate.    Vicie Mutters, PA-C 9:35 AM [facility]

## 2018-05-14 ENCOUNTER — Ambulatory Visit: Payer: 59 | Admitting: Physician Assistant

## 2018-05-14 ENCOUNTER — Encounter: Payer: Self-pay | Admitting: Physician Assistant

## 2018-05-14 ENCOUNTER — Other Ambulatory Visit: Payer: Self-pay | Admitting: Physician Assistant

## 2018-05-14 ENCOUNTER — Other Ambulatory Visit: Payer: Self-pay

## 2018-05-14 VITALS — BP 116/68 | HR 62 | Temp 97.9°F | Ht 73.0 in | Wt 298.3 lb

## 2018-05-14 DIAGNOSIS — E559 Vitamin D deficiency, unspecified: Secondary | ICD-10-CM | POA: Diagnosis not present

## 2018-05-14 DIAGNOSIS — Z6837 Body mass index (BMI) 37.0-37.9, adult: Secondary | ICD-10-CM

## 2018-05-14 DIAGNOSIS — Z79899 Other long term (current) drug therapy: Secondary | ICD-10-CM | POA: Diagnosis not present

## 2018-05-14 DIAGNOSIS — E039 Hypothyroidism, unspecified: Secondary | ICD-10-CM

## 2018-05-14 DIAGNOSIS — I10 Essential (primary) hypertension: Secondary | ICD-10-CM

## 2018-05-14 DIAGNOSIS — E785 Hyperlipidemia, unspecified: Secondary | ICD-10-CM

## 2018-05-14 DIAGNOSIS — R7309 Other abnormal glucose: Secondary | ICD-10-CM

## 2018-05-14 MED ORDER — TRIAMCINOLONE ACETONIDE 0.5 % EX CREA: 1.0000 "application " | TOPICAL_CREAM | Freq: Two times a day (BID) | CUTANEOUS | 2 refills | Status: AC

## 2018-05-14 MED ORDER — OLMESARTAN MEDOXOMIL-HCTZ 40-25 MG PO TABS: 1.0000 | ORAL_TABLET | Freq: Every day | ORAL | 3 refills | Status: DC

## 2018-05-14 NOTE — Patient Instructions (Addendum)
Check out  Mini habits for weight loss book  2 apps for tracking food is myfitness pal  loseit OR can take picture of your food  Check to see if your multivitamin has biotin in it If it does than stop the vitamin for 2 weeks and we need to recheck your thyroid  Your ears and sinuses are connected by the eustachian tube. When your sinuses are inflamed, this can close off the tube and cause fluid to collect in your middle ear. This can then cause dizziness, popping, clicking, ringing, and echoing in your ears. This is often NOT an infection and does NOT require antibiotics, it is caused by inflammation so the treatments help the inflammation. This can take a long time to get better so please be patient.  Here are things you can do to help with this: - Try the Flonase or Nasonex. Remember to spray each nostril twice towards the outer part of your eye.  Do not sniff but instead pinch your nose and tilt your head back to help the medicine get into your sinuses.  The best time to do this is at bedtime.Stop if you get blurred vision or nose bleeds.  -While drinking fluids, pinch and hold nose close and swallow, to help open eustachian tubes to drain fluid behind ear drums. -Please pick one of the over the counter allergy medications below and take it once daily for allergies.  It will also help with fluid behind ear drums. Claritin or loratadine cheapest but likely the weakest  Zyrtec or certizine at night because it can make you sleepy The strongest is allegra or fexafinadine  Cheapest at [employer], [employer], [employer] -can use decongestant over the counter, please do not use if you have high blood pressure or certain heart conditions.   if worsening HA, changes vision/speech, imbalance, weakness go to the ER    Intermittent fasting is more about strategy than starvation. It's meant to reset your body in different ways, hopefully with fitness and nutrition changes as a result.  Like any big switchover,  though, results may vary when it comes down to the individual level. What works for your friends may not work for you, or vice versa. That's why it's helpful to play around with variations on intermittent fasting and healthy habits and find what works best for you.  WHAT IS INTERMITTENT FASTING AND WHY DO IT?  Intermittent fasting doesn't involve specific foods, but rather, a strict schedule regarding when you eat. Also called "time-restricted eating," the tactic has been praised for its contribution to weight loss, improved body composition, and decreased cravings. Preliminary research also suggests it may be beneficial for glucose tolerance, hormone regulation, better muscle mass and lower body fat.  Part of its appeal is the simplicity of the effort. Unlike some other trends, there's no calculations to intermittent fasting.  You simply eat within a certain block of time, usually a window of 8-10 hours. In the other big block of time - about 14-16 hours, including when you're asleep - you don't eat anything, not even snacks. You can drink water, coffee, tea or any other beverage that doesn't have calories.  For example, if you like having a late dinner, you might skip breakfast and have your first meal at noon and your last meal of the day at 8 p.m., and then not eat until noon again the next day.  IDEAS FOR GETTING STARTED  If you're new to the strategy, it may be helpful to eat  within the typical circadian rhythm and keep eating within daylight hours. This can be especially beneficial if you're looking at intermittent fasting for weight-loss goals.  So first try only eating between 12pm to 8pm.  Outside of this time you may have water, black coffee, and hot tea. You may not eat it drink anything that has carbs, sugars, OR artificial sugars like diet soda.   Like any major eating and fitness shift, it can take time to find the perfect fit, so don't be afraid to experiment with different options  - including ditching intermittent fasting altogether if it's simply not for you. But if it is, you may be surprised by some of the benefits that come along with the strategy.     When it comes to diets, agreement about the perfect plan isn't easy to find, even among the experts. Experts at the [employer] developed an idea known as the Healthy Eating Plate. Just imagine a plate divided into logical, healthy portions.  The emphasis is on diet quality:  Load up on vegetables and fruits - one-half of your plate: Aim for color and variety, and remember that potatoes don't count.  Go for whole grains - one-quarter of your plate: Whole wheat, barley, wheat berries, quinoa, oats, brown rice, and foods made with them. If you want pasta, go with whole wheat pasta.  Protein power - one-quarter of your plate: Fish, chicken, beans, and nuts are all healthy, versatile protein sources. Limit red meat.  The diet, however, does go beyond the plate, offering a few other suggestions.  Use healthy plant oils, such as olive, canola, soy, corn, sunflower and peanut. Check the labels, and avoid partially hydrogenated oil, which have unhealthy trans fats.  If you're thirsty, drink water. Coffee and tea are good in moderation, but skip sugary drinks and limit milk and dairy products to one or two daily servings.  The type of carbohydrate in the diet is more important than the amount. Some sources of carbohydrates, such as vegetables, fruits, whole grains, and beans-are healthier than others.  Finally, stay active.

## 2018-05-15 LAB — CBC WITH DIFFERENTIAL/PLATELET
Basophils Absolute: 0.1 10*3/uL (ref 0.0–0.2)
Basos: 1 %
EOS (ABSOLUTE): 0.3 10*3/uL (ref 0.0–0.4)
Eos: 4 %
Hematocrit: 51.7 % — ABNORMAL HIGH (ref 37.5–51.0)
Hemoglobin: 17.5 g/dL (ref 13.0–17.7)
Immature Grans (Abs): 0 10*3/uL (ref 0.0–0.1)
Immature Granulocytes: 0 %
Lymphocytes Absolute: 1.3 10*3/uL (ref 0.7–3.1)
Lymphs: 23 %
MCH: 30.8 pg (ref 26.6–33.0)
MCHC: 33.8 g/dL (ref 31.5–35.7)
MCV: 91 fL (ref 79–97)
Monocytes Absolute: 0.6 10*3/uL (ref 0.1–0.9)
Monocytes: 10 %
Neutrophils Absolute: 3.5 10*3/uL (ref 1.4–7.0)
Neutrophils: 62 %
Platelets: 220 10*3/uL (ref 150–450)
RBC: 5.69 x10E6/uL (ref 4.14–5.80)
RDW: 12.3 % (ref 11.6–15.4)
WBC: 5.7 10*3/uL (ref 3.4–10.8)

## 2018-05-15 LAB — COMPREHENSIVE METABOLIC PANEL
ALT: 36 IU/L (ref 0–44)
AST: 29 IU/L (ref 0–40)
Albumin/Globulin Ratio: 1.7 (ref 1.2–2.2)
Albumin: 4.6 g/dL (ref 3.8–4.8)
Alkaline Phosphatase: 84 IU/L (ref 39–117)
BUN/Creatinine Ratio: 16 (ref 10–24)
BUN: 21 mg/dL (ref 8–27)
Bilirubin Total: 0.5 mg/dL (ref 0.0–1.2)
CO2: 23 mmol/L (ref 20–29)
Calcium: 10.1 mg/dL (ref 8.6–10.2)
Chloride: 102 mmol/L (ref 96–106)
Creatinine, Ser: 1.33 mg/dL — ABNORMAL HIGH (ref 0.76–1.27)
GFR calc Af Amer: 65 mL/min/{1.73_m2} (ref >59)
GFR calc non Af Amer: 56 mL/min/{1.73_m2} — ABNORMAL LOW (ref >59)
Globulin, Total: 2.7 g/dL (ref 1.5–4.5)
Glucose: 92 mg/dL (ref 65–99)
Potassium: 4.3 mmol/L (ref 3.5–5.2)
Sodium: 141 mmol/L (ref 134–144)
Total Protein: 7.3 g/dL (ref 6.0–8.5)

## 2018-05-15 LAB — TSH+FREE T4
Free T4: 1.82 ng/dL — ABNORMAL HIGH (ref 0.82–1.77)
TSH: 1.44 u[IU]/mL (ref 0.450–4.500)

## 2018-05-15 LAB — VITAMIN D 25 HYDROXY (VIT D DEFICIENCY, FRACTURES): Vit D, 25-Hydroxy: 72 ng/mL (ref 30.0–100.0)

## 2018-05-15 LAB — LIPID PANEL W/O CHOL/HDL RATIO
Cholesterol, Total: 177 mg/dL (ref 100–199)
HDL: 45 mg/dL (ref >39)
LDL Calculated: 108 mg/dL — ABNORMAL HIGH (ref 0–99)
Triglycerides: 122 mg/dL (ref 0–149)
VLDL Cholesterol Cal: 24 mg/dL (ref 5–40)

## 2018-05-15 LAB — MAGNESIUM: Magnesium: 2.1 mg/dL (ref 1.6–2.3)

## 2018-11-08 NOTE — Progress Notes (Signed)
Complete Physical  Assessment and Plan: [facility] DONE [facility]-  Sent with hand written Rx CBC, CMP, Mg, Lipid panel, TSH, Vitamin D, PSA, Urine Microalbumin/creatinin ratio.  Julian Lee was seen today for annual exam.  Diagnoses and all orders for this visit:  Encounter for general adult medical examination with abnormal findings Yearly  Essential hypertension Continue current medications: Benicar HCT 40-25mg  daily in morning Monitor blood pressure at home; call if consistently over 130/80 Continue DASH diet.   Reminder to go to the ER if any CP, SOB, nausea, dizziness, severe HA, changes vision/speech, left arm numbness and tingling and jaw pain. -     EKG 12-Lead  Hyperlipidemia, unspecified hyperlipidemia type No medication at this time Discussed dietary and exercise modifications  Hypothyroidism, unspecified type Taking levothyroxine 170mcg daily Reminder to take on an empty stomach 30-51mins before first meal of the day. No antacid medications for 4 hours.  Vitamin D deficiency Taking 2,000IU daily  Medication management Continued  Class 2 severe obesity with serious comorbidity and body mass index (BMI) of 39.0 to 39.9 in adult, unspecified obesity type ([place]) Discussed dietary and exercise modifications Walks 4/7 days a week Increase vegetables and decrease carbohydrates.  Abnormal glucose Discussed dietary and exercise modifications  BPH associated with nocturia Taking Cardura 4mg  daily Wakes 3 times a night Continue to monitor  Hx of adenomatous colonic polyps Colonoscopy Q3 years, due 2022  Need for immunization against influenza Received today -     Flu vaccine HIGH DOSE PF  Need for immunization Prevnar13  -     Pneumococcal conjugate vaccine 13-valent IM  Psorisis -     triamcinolone ointment (KENALOG) 0.1 %; Apply 1 application topically 2 (two) times daily.   Follow up in 6 months.  [facility] Discussed med's effects and SE's.  Screening labs and tests as requested with regular follow-up as recommended.  57min time spent with interview, examination, chart review, orders and recommendations. Continue medications as prescribed until labs result.   HPI He has had elevated blood pressure for over 15 + years. Reports he checks his blood pressure at home intermittently and it is in the normal range. His blood pressure is not checked at home, he is on Olmesartan 40/HCTZ25mg , today their BP is    Lab Results  Component Value Date   GFRNONAA 56 (L) 05/14/2018   He does workout, does cardio 30 mins 4 x a week.  He denies chest pain, shortness of breath, dizziness.  He has history of eczema on his hands, uses triamcinolone.  He is not on cholesterol medication and denies myalgias. His cholesterol is at goal. The cholesterol last visit was:   Lab Results  Component Value Date   CHOL 177 05/14/2018   HDL 45 05/14/2018   LDLCALC 108 (H) 05/14/2018   TRIG 122 05/14/2018   CHOLHDL 2.9 04/07/2014    Last A1C in the office was:  Lab Results  Component Value Date   HGBA1C 5.2 11/09/2017   Patient is on Vitamin D supplement.   Lab Results  Component Value Date   VD25OH 72.0 05/14/2018     He is on thyroid medication. His medication was not changed last visit. Patient denies nervousness, palpitations and weight changes.  Lab Results  Component Value Date   TSH 1.440 05/14/2018   BMI is There is no height or weight on file to calculate BMI., he is working on diet and exercise. Wt Readings from Last 3 Encounters:  05/14/18 298 lb 4.8 oz (135.3 kg)  11/07/17 296 lb 3.2 oz (134.4 kg)  05/08/17 288 lb 9.6 oz (130.9 kg)   Lab Results  Component Value Date   PSA 0.70 10/01/2013     Current Medications:  Current Outpatient Medications on File Prior to Visit  Medication Sig Dispense Refill  . BABY ASPIRIN PO Take 81 mg by mouth daily.    . Cholecalciferol (VITAMIN D PO) Take 2,000 Units by mouth 3 (three) times  daily.     Marland Kitchen doxazosin (CARDURA) 4 MG tablet TAKE 1 TABLET BY MOUTH  DAILY 90 tablet 3  . Flaxseed, Linseed, (FLAX SEED OIL PO) Take by mouth daily. Takes 2 caps daily    . levothyroxine (SYNTHROID, LEVOTHROID) 175 MCG tablet TAKE 1 TABLET BY MOUTH IN  THE MORNING WITH WATER ONLY FOR 30 MINUTES AND NO  ANTACID MEDICATIONS FOR 4  HOURS AS DIRECTED 90 tablet 3  . olmesartan-hydrochlorothiazide (BENICAR HCT) 40-25 MG tablet Take 1 tablet by mouth daily. 90 tablet 3  . triamcinolone cream (KENALOG) 0.5 % Apply 1 application topically 2 (two) times daily. 80 g 2   No current facility-administered medications on file prior to visit.    Health Maintenance:  Immunization History  Administered Date(s) Administered  . Influenza Inj Mdck Quad With Preservative 11/08/2016, 11/07/2017  . Influenza Split 09/26/2012  . PPD Test 04/01/2013  . Pneumococcal Polysaccharide-23 09/16/2008  . Td 08/22/2006  . Tdap 11/08/2016  . Zoster 01/10/2006   Tetanus: 2018 Pneumovax: 2010 Prevnar 13: 11/2018 Flu vaccine: 11/2018 Zostavax: 2008   CXR 2016 Colonoscopy:2019 recall 2022 3 adenomas, Dr. Carlean Purl Cardiolite negative 2009 DEE: 6 years ago- has not had.    Eye Exam: Due Dental Exam: 09/2018  Allergies:  Allergies  Allergen Reactions  . Latex      Medical History:  Past Medical History:  Diagnosis Date  . Allergy   . Graves disease    pt. had radiation and is now on Thyroid medication   . Hx of adenomatous colonic polyps 02/16/2017  . Hyperlipidemia    not on meds  only LDL in the past  . Hypertension   . Thyroid disease   . Vitamin D deficiency    SURGICAL HISTORY He  has a past surgical history that includes Tonsillectomy and adenoidectomy. FAMILY HISTORY His family history includes Cancer in his mother; Heart attack in his father; Rheum arthritis in his mother. SOCIAL HISTORY He  reports that he quit smoking about 14 years ago. He quit smokeless tobacco use about 28 years ago. He  reports current alcohol use. He reports that he does not use drugs.   Review of Systems  Constitutional: Negative.   HENT: Negative.   Eyes: Negative.   Respiratory: Negative.   Cardiovascular: Negative.   Gastrointestinal: Negative.   Genitourinary: Negative.        Nocturia x 2, mild BPH symptoms, declines medications, no change  Musculoskeletal: Negative.   Skin: Negative.   Neurological: Negative.   Endo/Heme/Allergies: Negative.   Psychiatric/Behavioral: Negative.      Physical Exam: Estimated body mass index is 39.36 kg/m as calculated from the following:   Height as of 05/14/18: 6\' 1"  (1.854 m).   Weight as of 05/14/18: 298 lb 4.8 oz (135.3 kg). There were no vitals taken for this visit. General Appearance: Well nourished, in no apparent distress. Eyes: PERRLA, EOMs, conjunctiva no swelling or erythema, normal fundi and vessels. Sinuses: No Frontal/maxillary tenderness ENT/Mouth: Ext aud canals clear,  normal light reflex with TMs without erythema, bulging. Good dentition. No erythema, swelling, or exudate on post pharynx. Tonsils not swollen or erythematous. Hearing normal.  Neck: Supple, thyroid normal. No bruits Respiratory: Respiratory effort normal, BS equal bilaterally without rales, rhonchi, wheezing or stridor. Cardio: RRR without murmurs, rubs or gallops. Brisk peripheral pulses without edema.  Chest: symmetric, with normal excursions and percussion. Abdomen: Soft, +BS, obese. Non tender, no guarding, rebound, hernias, masses, or organomegaly.  Lymphatics: Non tender without lymphadenopathy.  Genitourinary: defer Musculoskeletal: Full ROM all peripheral extremities,5/5 strength, and normal gait. Skin: Warm, dry without rashes, lesions, ecchymosis.  Neuro: Cranial nerves intact, reflexes equal bilaterally. Normal muscle tone, no cerebellar symptoms. Sensation intact.  Psych: Awake and oriented X 3, normal affect, Insight and Judgment appropriate.   EKG: WNL, No ST  changes Garnet Sierras, NP [facility] 11/12/2018  2:20 PM

## 2018-11-11 NOTE — Patient Instructions (Signed)
You received vaccination today for influenza and Prevnar13.  We have provided a written order for your labs.  We will contact you via MyChart in 1-3 days via [facility].    Vit D  & Vit C 1,000 mg   are recommended to help protect  against the Covid-19 and other Corona viruses.    Also it's recommended  to take  Zinc 50 mg  to help  protect against the Covid-19   and best place to get  is also on [email or web address]  and don't pay more than 6-8 cents /pill !  ================================ Coronavirus (COVID-19) Are you at risk?  Are you at risk for the Coronavirus (COVID-19)?  To be considered HIGH RISK for Coronavirus (COVID-19), you have to meet the following criteria:  . Traveled to [place], [place], [place], [place] or [place]; or in the [place] to [place], [place], [place]  . or [place]; and have fever, cough, and shortness of breath within the last 2 weeks of travel OR . Been in close contact with a person diagnosed with COVID-19 within the last 2 weeks and have  . fever, cough,and shortness of breath .  . IF YOU DO NOT MEET THESE CRITERIA, YOU ARE CONSIDERED LOW RISK FOR COVID-19.  What to do if you are HIGH RISK for COVID-19?  Marland Kitchen If you are having a medical emergency, call 911. . Seek medical care right away. Before you go to a doctor's office, urgent care or emergency department, .  call ahead and tell them about your recent travel, contact with someone diagnosed with COVID-19  .  and your symptoms.  . You should receive instructions from your physician's office regarding next steps of care.  . When you arrive at healthcare provider, tell the healthcare staff immediately you have returned from  . visiting [place], [place], [place], [place] or [place]; or traveled in the [place] to [place], [place],  . [place] or [place] in the last two weeks or you have been in close contact with a person diagnosed with  . COVID-19 in the last 2 weeks.   . Tell  the health care staff about your symptoms: fever, cough and shortness of breath. . After you have been seen by a medical provider, you will be either: o Tested for (COVID-19) and discharged home on quarantine except to seek medical care if  o symptoms worsen, and asked to  - Stay home and avoid contact with others until you get your results (4-5 days)  - Avoid travel on public transportation if possible (such as bus, train, or airplane) or o Sent to the Emergency Department by EMS for evaluation, COVID-19 testing  and  o possible admission depending on your condition and test results.  What to do if you are LOW RISK for COVID-19?  Reduce your risk of any infection by using the same precautions used for avoiding the common cold or flu:  Marland Kitchen Wash your hands often with soap and warm water for at least 20 seconds.  If soap and water are not readily available,  . use an alcohol-based hand sanitizer with at least 60% alcohol.  . If coughing or sneezing, cover your mouth and nose by coughing or sneezing into the elbow areas of your shirt or coat, .  into a tissue or into your sleeve (not your hands). . Avoid shaking hands with others and consider head nods or verbal greetings only. . Avoid touching your eyes, nose, or  mouth with unwashed hands.  . Avoid close contact with people who are sick. . Avoid places or events with large numbers of people in one location, like concerts or sporting events. . Carefully consider travel plans you have or are making. . If you are planning any travel outside or inside the [place], visit the CDC's Travelers' Health webpage for the latest health notices. . If you have some symptoms but not all symptoms, continue to monitor at home and seek medical attention  . if your symptoms worsen. . If you are having a medical emergency, call 911. >>>>>>>>>>>>>>>>>>>>>>>>>>>>>>>>>>>>>>>>>>>>>>>>>>>>>>> We Do NOT Approve of  [facility] [place] Soliciting Our Patients   To Do Home Visits  & We Do NOT Approve of [employer] > > > > > > > > > > > > > > > > > > > > > > > > > > > > > > > > > > >  > > > >   Preventive Care for Adults  A healthy lifestyle and preventive care can promote health and wellness. Preventive health guidelines for men include the following key practices:  A routine yearly physical is a good way to check with your health care provider about your health and preventative screening. It is a chance to share any concerns and updates on your health and to receive a thorough exam.  Visit your dentist for a routine exam and preventative care every 6 months. Brush your teeth twice a day and floss once a day. Good oral hygiene prevents tooth decay and gum disease.  The frequency of eye exams is based on your age, health, family medical history, use of contact lenses, and other factors. Follow your health care provider's recommendations for frequency of eye exams.  Eat a healthy diet. Foods such as vegetables, fruits, whole grains, low-fat dairy products, and lean protein foods contain the nutrients you need without too many calories. Decrease your intake of foods high in solid fats, added sugars, and salt. Eat the right amount of calories for you. Get information about a proper diet from your health care provider, if necessary.  Regular physical exercise is one of the most important things you can do for your health. Most adults should get at least 150 minutes of moderate-intensity exercise (any activity that increases your heart rate and causes you to sweat) each week. In addition, most adults need muscle-strengthening exercises on 2 or more days a week.  Maintain a healthy weight. The body mass index (BMI) is a screening tool to identify possible weight problems. It provides an estimate of body fat based on height and weight. Your health care provider can find your BMI and can help you achieve or maintain a healthy weight. For adults 20 years  and older:  A BMI below 18.5 is considered underweight.  A BMI of 18.5 to 24.9 is normal.  A BMI of 25 to 29.9 is considered overweight.  A BMI of 30 and above is considered obese.  Maintain normal blood lipids and cholesterol levels by exercising and minimizing your intake of saturated fat. Eat a balanced diet with plenty of fruit and vegetables. Blood tests for lipids and cholesterol should begin at age 63 and be repeated every 5 years. If your lipid or cholesterol levels are high, you are over 50, or you are at high risk for heart disease, you may need your cholesterol levels checked more frequently. Ongoing high lipid and cholesterol levels should be treated with medicines  if diet and exercise are not working.  If you smoke, find out from your health care provider how to quit. If you do not use tobacco, do not start.  Lung cancer screening is recommended for adults aged 24-80 years who are at high risk for developing lung cancer because of a history of smoking. A yearly low-dose CT scan of the lungs is recommended for people who have at least a 30-pack-year history of smoking and are a current smoker or have quit within the past 15 years. A pack year of smoking is smoking an average of 1 pack of cigarettes a day for 1 year (for example: 1 pack a day for 30 years or 2 packs a day for 15 years). Yearly screening should continue until the smoker has stopped smoking for at least 15 years. Yearly screening should be stopped for people who develop a health problem that would prevent them from having lung cancer treatment.  If you choose to drink alcohol, do not have more than 2 drinks per day. One drink is considered to be 12 ounces (355 mL) of beer, 5 ounces (148 mL) of wine, or 1.5 ounces (44 mL) of liquor.  Avoid use of street drugs. Do not share needles with anyone. Ask for help if you need support or instructions about stopping the use of drugs.  High blood pressure causes heart disease and  increases the risk of stroke. Your blood pressure should be checked at least every 1-2 years. Ongoing high blood pressure should be treated with medicines, if weight loss and exercise are not effective.  If you are 30-11 years old, ask your health care provider if you should take aspirin to prevent heart disease.  Diabetes screening involves taking a blood sample to check your fasting blood sugar level. Testing should be considered at a younger age or be carried out more frequently if you are overweight and have at least 1 risk factor for diabetes.  Colorectal cancer can be detected and often prevented. Most routine colorectal cancer screening begins at the age of 69 and continues through age 46. However, your health care provider may recommend screening at an earlier age if you have risk factors for colon cancer. On a yearly basis, your health care provider may provide home test kits to check for hidden blood in the stool. Use of a small camera at the end of a tube to directly examine the colon (sigmoidoscopy or colonoscopy) can detect the earliest forms of colorectal cancer. Talk to your health care provider about this at age 34, when routine screening begins. Direct exam of the colon should be repeated every 5-10 years through age 3, unless early forms of precancerous polyps or small growths are found.  Hepatitis C blood testing is recommended for all people born from [birth year] through 1967 and any individual with known risks for hepatitis C.  Screening for abdominal aortic aneurysm (AAA)  by ultrasound is recommended for people who have history of high blood pressure or who are current or former smokers.  Healthy men should  receive prostate-specific antigen (PSA) blood tests as part of routine cancer screening. Talk with your health care provider about prostate cancer screening.  Testicular cancer screening is  recommended for adult males. Screening includes self-exam, a health care provider exam, and  other screening tests. Consult with your health care provider about any symptoms you have or any concerns you have about testicular cancer.  Use sunscreen. Apply sunscreen liberally and repeatedly throughout the day.  You should seek shade when your shadow is shorter than you. Protect yourself by wearing long sleeves, pants, a wide-brimmed hat, and sunglasses year round, whenever you are outdoors.  Once a month, do a whole-body skin exam, using a mirror to look at the skin on your back. Tell your health care provider about new moles, moles that have irregular borders, moles that are larger than a pencil eraser, or moles that have changed in shape or color.  Stay current with required vaccines (immunizations).  Influenza vaccine. All adults should be immunized every year.  Tetanus, diphtheria, and acellular pertussis (Td, Tdap) vaccine. An adult who has not previously received Tdap or who does not know his vaccine status should receive 1 dose of Tdap. This initial dose should be followed by tetanus and diphtheria toxoids (Td) booster doses every 10 years. Adults with an unknown or incomplete history of completing a 3-dose immunization series with Td-containing vaccines should begin or complete a primary immunization series including a Tdap dose. Adults should receive a Td booster every 10 years.  Zoster vaccine. One dose is recommended for adults aged 1 years or older unless certain conditions are present.    PREVNAR - Pneumococcal 13-valent conjugate (PCV13) vaccine. When indicated, a person who is uncertain of his immunization history and has no record of immunization should receive the PCV13 vaccine. An adult aged 70 years or older who has certain medical conditions and has not been previously immunized should receive 1 dose of PCV13 vaccine. This PCV13 should be followed with a dose of pneumococcal polysaccharide (PPSV23) vaccine. The PPSV23 vaccine dose should be obtained 1 or more year(s)after  the dose of PCV13 vaccine. An adult aged 51 years or older who has certain medical conditions and previously received 1 or more doses of PPSV23 vaccine should receive 1 dose of PCV13. The PCV13 vaccine dose should be obtained 1 or more years after the last PPSV23 vaccine dose.    PNEUMOVAX - Pneumococcal polysaccharide (PPSV23) vaccine. When PCV13 is also indicated, PCV13 should be obtained first. All adults aged 14 years and older should be immunized. An adult younger than age 85 years who has certain medical conditions should be immunized. Any person who resides in a nursing home or long-term care facility should be immunized. An adult smoker should be immunized. People with an immunocompromised condition and certain other conditions should receive both PCV13 and PPSV23 vaccines. People with human immunodeficiency virus (HIV) infection should be immunized as soon as possible after diagnosis. Immunization during chemotherapy or radiation therapy should be avoided. Routine use of PPSV23 vaccine is not recommended for American Indians, [place] Natives, or people younger than 65 years unless there are medical conditions that require PPSV23 vaccine. When indicated, people who have unknown immunization and have no record of immunization should receive PPSV23 vaccine. One-time revaccination 5 years after the first dose of PPSV23 is recommended for people aged 19-64 years who have chronic kidney failure, nephrotic syndrome, asplenia, or immunocompromised conditions. People who received 1-2 doses of PPSV23 before age 54 years should receive another dose of PPSV23 vaccine at age 31 years or later if at least 5 years have passed since the previous dose. Doses of PPSV23 are not needed for people immunized with PPSV23 at or after age 49 years.    Hepatitis A vaccine. Adults who wish to be protected from this disease, have certain high-risk conditions, work with hepatitis A-infected animals, work in hepatitis A research  labs, or travel to or work in  countries with a high rate of hepatitis A should be immunized. Adults who were previously unvaccinated and who anticipate close contact with an international adoptee during the first 60 days after arrival in the [place] from a country with a high rate of hepatitis A should be immunized.    Hepatitis B vaccine. Adults should be immunized if they wish to be protected from this disease, have certain high-risk conditions, may be exposed to blood or other infectious body fluids, are household contacts or sex partners of hepatitis B positive people, are clients or workers in certain care facilities, or travel to or work in countries with a high rate of hepatitis B.   Preventive Service / Frequency   Ages 12 and over  Blood pressure check.  Lipid and cholesterol check.  Lung cancer screening. / Every year if you are aged 51-80 years and have a 30-pack-year history of smoking and currently smoke or have quit within the past 15 years. Yearly screening is stopped once you have quit smoking for at least 15 years or develop a health problem that would prevent you from having lung cancer treatment.  Fecal occult blood test (FOBT) of stool. You may not have to do this test if you get a colonoscopy every 10 years.  Flexible sigmoidoscopy** or colonoscopy.** / Every 5 years for a flexible sigmoidoscopy or every 10 years for a colonoscopy beginning at age 63 and continuing until age 68.  Hepatitis C blood test.** / For all people born from [birth year] through 1967 and any individual with known risks for hepatitis C.  Abdominal aortic aneurysm (AAA) screening./ Screening current or former smokers or have Hypertension.  Skin self-exam. / Monthly.  Influenza vaccine. / Every year.  Tetanus, diphtheria, and acellular pertussis (Tdap/Td) vaccine.** / 1 dose of Td every 10 years.   Zoster vaccine.** / 1 dose for adults aged 68 years or older.         Pneumococcal 13-valent  conjugate (PCV13) vaccine.    Pneumococcal polysaccharide (PPSV23) vaccine.     Hepatitis A vaccine.** / Consult your health care provider.  Hepatitis B vaccine.** / Consult your health care provider. Screening for abdominal aortic aneurysm (AAA)  by ultrasound is recommended for people who have history of high blood pressure or who are current or former smokers. ++++++++++ Recommend Adult Low Dose Aspirin or  coated  Aspirin 81 mg daily  To reduce risk of Colon Cancer 40 %,  Skin Cancer 26 % ,  Malignant Melanoma 46%  and  Pancreatic cancer 60% ++++++++++++++++++++++ Vitamin D goal  is between 70-100.  Please make sure that you are taking your Vitamin D as directed.  It is very important as a natural anti-inflammatory  helping hair, skin, and nails, as well as reducing stroke and heart attack risk.  It helps your bones and helps with mood. It also decreases numerous cancer risks so please take it as directed.  Low Vit D is associated with a 200-300% higher risk for CANCER  and 200-300% higher risk for HEART   ATTACK  &  STROKE.   .....................................Marland Kitchen It is also associated with higher death rate at younger ages,  autoimmune diseases like Rheumatoid arthritis, Lupus, Multiple Sclerosis.    Also many other serious conditions, like depression, Alzheimer's Dementia, infertility, muscle aches, fatigue, fibromyalgia - just to name a few. ++++++++++++++++++++++ Recommend the book "The END of DIETING" by Dr Excell Seltzer  & the book "The END of DIABETES " by Dr Excell Seltzer  At [email or web address] - get book & Audio CD's    Being diabetic has a  300% increased risk for heart attack, stroke, cancer, and alzheimer- type vascular dementia. It is very important that you work harder with diet by avoiding all foods that are white. Avoid white rice (brown & wild rice is OK), white potatoes (sweetpotatoes in moderation is OK), White bread or wheat bread or anything made out of white  flour like bagels, donuts, rolls, buns, biscuits, cakes, pastries, cookies, pizza crust, and pasta (made from white flour & egg whites) - vegetarian pasta or spinach or wheat pasta is OK. Multigrain breads like Arnold's or Pepperidge [employer], or multigrain sandwich thins or flatbreads.  Diet, exercise and weight loss can reverse and cure diabetes in the early stages.  Diet, exercise and weight loss is very important in the control and prevention of complications of diabetes which affects every system in your body, ie. Brain - dementia/stroke, eyes - glaucoma/blindness, heart - heart attack/heart failure, kidneys - dialysis, stomach - gastric paralysis, intestines - malabsorption, nerves - severe painful neuritis, circulation - gangrene & loss of a leg(s), and finally cancer and Alzheimers.    I recommend avoid fried & greasy foods,  sweets/candy, white rice (brown or wild rice or Quinoa is OK), white potatoes (sweet potatoes are OK) - anything made from white flour - bagels, doughnuts, rolls, buns, biscuits,white and wheat breads, pizza crust and traditional pasta made of white flour & egg white(vegetarian pasta or spinach or wheat pasta is OK).  Multi-grain bread is OK - like multi-grain flat bread or sandwich thins. Avoid alcohol in excess. Exercise is also important.    Eat all the vegetables you want - avoid meat, especially red meat and dairy - especially cheese.  Cheese is the most concentrated form of trans-fats which is the worst thing to clog up our arteries. Veggie cheese is OK which can be found in the fresh produce section at [employer] or [employer] or [employer]  ++++++++++++++++++++++ DASH Eating Plan  DASH stands for "Dietary Approaches to Stop Hypertension."   The DASH eating plan is a healthy eating plan that has been shown to reduce high blood pressure (hypertension). Additional health benefits may include reducing the risk of type 2 diabetes mellitus, heart disease, and stroke. The  DASH eating plan may also help with weight loss. WHAT DO I NEED TO KNOW ABOUT THE DASH EATING PLAN? For the DASH eating plan, you will follow these general guidelines:  Choose foods with a percent daily value for sodium of less than 5% (as listed on the food label).  Use salt-free seasonings or herbs instead of table salt or sea salt.  Check with your health care provider or pharmacist before using salt substitutes.  Eat lower-sodium products, often labeled as "lower sodium" or "no salt added."  Eat fresh foods.  Eat more vegetables, fruits, and low-fat dairy products.  Choose whole grains. Look for the word "whole" as the first word in the ingredient list.  Choose fish   Limit sweets, desserts, sugars, and sugary drinks.  Choose heart-healthy fats.  Eat veggie cheese   Eat more home-cooked food and less restaurant, buffet, and fast food.  Limit fried foods.  Cook foods using methods other than frying.  Limit canned vegetables. If you do use them, rinse them well to decrease the sodium.  When eating at a restaurant, ask that your food be prepared with less salt, or no salt if possible.  WHAT FOODS CAN I EAT? Read Dr Fara Olden Fuhrman's books on The End of Dieting & The End of Diabetes  Grains Whole grain or whole wheat bread. Brown rice. Whole grain or whole wheat pasta. Quinoa, bulgur, and whole grain cereals. Low-sodium cereals. Corn or whole wheat flour tortillas. Whole grain cornbread. Whole grain crackers. Low-sodium crackers.  Vegetables Fresh or frozen vegetables (raw, steamed, roasted, or grilled). Low-sodium or reduced-sodium tomato and vegetable juices. Low-sodium or reduced-sodium tomato sauce and paste. Low-sodium or reduced-sodium canned vegetables.   Fruits All fresh, canned (in natural juice), or frozen fruits.  Protein Products  All fish and seafood.  Dried beans, peas, or lentils. Unsalted nuts and seeds. Unsalted canned  beans.  Dairy Low-fat dairy products, such as skim or 1% milk, 2% or reduced-fat cheeses, low-fat ricotta or cottage cheese, or plain low-fat yogurt. Low-sodium or reduced-sodium cheeses.  Fats and Oils Tub margarines without trans fats. Light or reduced-fat mayonnaise and salad dressings (reduced sodium). Avocado. Safflower, olive, or canola oils. Natural peanut or almond butter.  Other Unsalted popcorn and pretzels. The items listed above may not be a complete list of recommended foods or beverages. Contact your dietitian for more options.  ++++++++++++++++++++  WHAT FOODS ARE NOT RECOMMENDED? Grains/ White flour or wheat flour White bread. White pasta. White rice. Refined cornbread. Bagels and croissants. Crackers that contain trans fat.  Vegetables  Creamed or fried vegetables. Vegetables in a . Regular canned vegetables. Regular canned tomato sauce and paste. Regular tomato and vegetable juices.  Fruits Dried fruits. Canned fruit in light or heavy syrup. Fruit juice.  Meat and Other Protein Products Meat in general - RED meat & White meat.  Fatty cuts of meat. Ribs, chicken wings, all processed meats as bacon, sausage, bologna, salami, fatback, hot dogs, bratwurst and packaged luncheon meats.  Dairy Whole or 2% milk, cream, half-and-half, and cream cheese. Whole-fat or sweetened yogurt. Full-fat cheeses or blue cheese. Non-dairy creamers and whipped toppings. Processed cheese, cheese spreads, or cheese curds.  Condiments Onion and garlic salt, seasoned salt, table salt, and sea salt. Canned and packaged gravies. Worcestershire sauce. Tartar sauce. Barbecue sauce. Teriyaki sauce. Soy sauce, including reduced sodium. Steak sauce. Fish sauce. Oyster sauce. Cocktail sauce. Horseradish. Ketchup and mustard. Meat flavorings and tenderizers. Bouillon cubes. Hot sauce. Tabasco sauce. Marinades. Taco seasonings. Relishes.  Fats and Oils Butter, stick margarine, lard, shortening and  bacon fat. Coconut, palm kernel, or palm oils. Regular salad dressings.  Pickles and olives. Salted popcorn and pretzels.  The items listed above may not be a complete list of foods and beverages to avoid.

## 2018-11-12 ENCOUNTER — Other Ambulatory Visit: Payer: Self-pay

## 2018-11-12 ENCOUNTER — Encounter: Payer: Self-pay | Admitting: Adult Health Nurse Practitioner

## 2018-11-12 ENCOUNTER — Ambulatory Visit: Payer: 59 | Admitting: Adult Health Nurse Practitioner

## 2018-11-12 VITALS — BP 128/90 | HR 85 | Temp 97.7°F | Ht 73.0 in | Wt 299.0 lb

## 2018-11-12 DIAGNOSIS — Z0000 Encounter for general adult medical examination without abnormal findings: Secondary | ICD-10-CM

## 2018-11-12 DIAGNOSIS — Z23 Encounter for immunization: Secondary | ICD-10-CM | POA: Diagnosis not present

## 2018-11-12 DIAGNOSIS — E559 Vitamin D deficiency, unspecified: Secondary | ICD-10-CM

## 2018-11-12 DIAGNOSIS — Z136 Encounter for screening for cardiovascular disorders: Secondary | ICD-10-CM

## 2018-11-12 DIAGNOSIS — Z79899 Other long term (current) drug therapy: Secondary | ICD-10-CM

## 2018-11-12 DIAGNOSIS — Z86010 Personal history of colonic polyps: Secondary | ICD-10-CM

## 2018-11-12 DIAGNOSIS — N401 Enlarged prostate with lower urinary tract symptoms: Secondary | ICD-10-CM

## 2018-11-12 DIAGNOSIS — I10 Essential (primary) hypertension: Secondary | ICD-10-CM

## 2018-11-12 DIAGNOSIS — R7309 Other abnormal glucose: Secondary | ICD-10-CM

## 2018-11-12 DIAGNOSIS — E66812 Obesity, class 2: Secondary | ICD-10-CM

## 2018-11-12 DIAGNOSIS — E785 Hyperlipidemia, unspecified: Secondary | ICD-10-CM

## 2018-11-12 DIAGNOSIS — Z860101 Personal history of adenomatous and serrated colon polyps: Secondary | ICD-10-CM

## 2018-11-12 DIAGNOSIS — Z0001 Encounter for general adult medical examination with abnormal findings: Secondary | ICD-10-CM

## 2018-11-12 DIAGNOSIS — L409 Psoriasis, unspecified: Secondary | ICD-10-CM

## 2018-11-12 DIAGNOSIS — E039 Hypothyroidism, unspecified: Secondary | ICD-10-CM

## 2018-11-13 ENCOUNTER — Other Ambulatory Visit: Payer: Self-pay | Admitting: Internal Medicine

## 2018-11-14 LAB — MAGNESIUM: Magnesium: 2.1 mg/dL (ref 1.6–2.3)

## 2018-11-14 LAB — CBC WITH DIFFERENTIAL/PLATELET
Basophils Absolute: 0.1 10*3/uL (ref 0.0–0.2)
Basos: 1 %
EOS (ABSOLUTE): 0.2 10*3/uL (ref 0.0–0.4)
Eos: 3 %
Hematocrit: 50.9 % (ref 37.5–51.0)
Hemoglobin: 17.1 g/dL (ref 13.0–17.7)
Immature Grans (Abs): 0 10*3/uL (ref 0.0–0.1)
Immature Granulocytes: 0 %
Lymphocytes Absolute: 0.9 10*3/uL (ref 0.7–3.1)
Lymphs: 16 %
MCH: 29.8 pg (ref 26.6–33.0)
MCHC: 33.6 g/dL (ref 31.5–35.7)
MCV: 89 fL (ref 79–97)
Monocytes Absolute: 0.5 10*3/uL (ref 0.1–0.9)
Monocytes: 9 %
Neutrophils Absolute: 4 10*3/uL (ref 1.4–7.0)
Neutrophils: 71 %
Platelets: 208 10*3/uL (ref 150–450)
RBC: 5.73 x10E6/uL (ref 4.14–5.80)
RDW: 12 % (ref 11.6–15.4)
WBC: 5.8 10*3/uL (ref 3.4–10.8)

## 2018-11-14 LAB — COMPREHENSIVE METABOLIC PANEL
ALT: 30 IU/L (ref 0–44)
AST: 26 IU/L (ref 0–40)
Albumin/Globulin Ratio: 1.6 (ref 1.2–2.2)
Albumin: 4.5 g/dL (ref 3.8–4.8)
Alkaline Phosphatase: 97 IU/L (ref 39–117)
BUN/Creatinine Ratio: 14 (ref 10–24)
BUN: 16 mg/dL (ref 8–27)
Bilirubin Total: 0.6 mg/dL (ref 0.0–1.2)
CO2: 27 mmol/L (ref 20–29)
Calcium: 9.8 mg/dL (ref 8.6–10.2)
Chloride: 100 mmol/L (ref 96–106)
Creatinine, Ser: 1.12 mg/dL (ref 0.76–1.27)
GFR calc Af Amer: 79 mL/min/{1.73_m2} (ref >59)
GFR calc non Af Amer: 69 mL/min/{1.73_m2} (ref >59)
Globulin, Total: 2.9 g/dL (ref 1.5–4.5)
Glucose: 92 mg/dL (ref 65–99)
Potassium: 5.1 mmol/L (ref 3.5–5.2)
Sodium: 139 mmol/L (ref 134–144)
Total Protein: 7.4 g/dL (ref 6.0–8.5)

## 2018-11-14 LAB — MICROALBUMIN / CREATININE URINE RATIO
Creatinine, Urine: 64.2 mg/dL
Microalb/Creat Ratio: 10 mg/g creat (ref 0–29)
Microalbumin, Urine: 6.1 ug/mL

## 2018-11-14 LAB — TSH: TSH: 0.953 u[IU]/mL (ref 0.450–4.500)

## 2018-11-14 LAB — PSA: Prostate Specific Ag, Serum: 0.9 ng/mL (ref 0.0–4.0)

## 2018-11-14 LAB — HGB A1C W/O EAG: Hgb A1c MFr Bld: 5.3 % (ref 4.8–5.6)

## 2018-11-14 LAB — LIPID PANEL W/O CHOL/HDL RATIO
Cholesterol, Total: 191 mg/dL (ref 100–199)
HDL: 47 mg/dL (ref >39)
LDL Chol Calc (NIH): 123 mg/dL — ABNORMAL HIGH (ref 0–99)
Triglycerides: 114 mg/dL (ref 0–149)
VLDL Cholesterol Cal: 21 mg/dL (ref 5–40)

## 2018-11-14 LAB — VITAMIN D 25 HYDROXY (VIT D DEFICIENCY, FRACTURES): Vit D, 25-Hydroxy: 74.6 ng/mL (ref 30.0–100.0)

## 2019-02-09 ENCOUNTER — Other Ambulatory Visit: Payer: Self-pay | Admitting: Physician Assistant

## 2019-02-09 DIAGNOSIS — E039 Hypothyroidism, unspecified: Secondary | ICD-10-CM

## 2019-02-18 ENCOUNTER — Other Ambulatory Visit: Payer: Self-pay | Admitting: Physician Assistant

## 2019-03-23 ENCOUNTER — Other Ambulatory Visit: Payer: Self-pay | Admitting: Physician Assistant

## 2019-03-23 DIAGNOSIS — I10 Essential (primary) hypertension: Secondary | ICD-10-CM

## 2019-04-04 ENCOUNTER — Ambulatory Visit: Payer: 59 | Attending: Internal Medicine

## 2019-04-04 DIAGNOSIS — Z23 Encounter for immunization: Secondary | ICD-10-CM

## 2019-04-04 NOTE — Progress Notes (Signed)
   Covid-19 Vaccination Clinic  Name:  Julian Lee    MRN: [id number] DOB: 1955-06-04  04/04/2019  [name] was observed post Covid-19 immunization for 15 minutes without incident. He was provided with Vaccine Information Sheet and instruction to access the V-Safe system.   [name] was instructed to call 911 with any severe reactions post vaccine: Marland Kitchen Difficulty breathing  . Swelling of face and throat  . A fast heartbeat  . A bad rash all over body  . Dizziness and weakness   Immunizations Administered    Name Date Dose VIS Date Route   Pfizer COVID-19 Vaccine 04/04/2019  1:39 PM 0.3 mL 12/21/2018 Intramuscular   Manufacturer: [place]   Lot: [id number]   [place]: [id number]

## 2019-04-29 ENCOUNTER — Ambulatory Visit: Payer: 59 | Attending: Internal Medicine

## 2019-04-29 DIAGNOSIS — Z23 Encounter for immunization: Secondary | ICD-10-CM

## 2019-04-29 NOTE — Progress Notes (Signed)
   Covid-19 Vaccination Clinic  Name:  Julian Lee    MRN: [id number] DOB: [birth year]  04/29/2019  Mr. Julian Lee was observed post Covid-19 immunization for 15 minutes without incident. He was provided with Vaccine Information Sheet and instruction to access the V-Safe system.   Mr. Julian Lee was instructed to call 911 with any severe reactions post vaccine: Marland Kitchen Difficulty breathing  . Swelling of face and throat  . A fast heartbeat  . A bad rash all over body  . Dizziness and weakness   Immunizations Administered    Name Date Dose VIS Date Route   Pfizer COVID-19 Vaccine 04/29/2019 11:41 AM 0.3 mL 03/06/2018 Intramuscular   Manufacturer: [place]   Lot: [id number]   [place]: [id number]

## 2019-05-14 ENCOUNTER — Encounter: Payer: Self-pay | Admitting: Adult Health Nurse Practitioner

## 2019-05-14 ENCOUNTER — Other Ambulatory Visit: Payer: Self-pay

## 2019-05-14 ENCOUNTER — Ambulatory Visit: Payer: 59 | Admitting: Adult Health Nurse Practitioner

## 2019-05-14 VITALS — BP 134/104 | HR 89 | Temp 96.1°F | Resp 16 | Ht 73.0 in | Wt 313.6 lb

## 2019-05-14 DIAGNOSIS — R7309 Other abnormal glucose: Secondary | ICD-10-CM

## 2019-05-14 DIAGNOSIS — I10 Essential (primary) hypertension: Secondary | ICD-10-CM | POA: Diagnosis not present

## 2019-05-14 DIAGNOSIS — R351 Nocturia: Secondary | ICD-10-CM

## 2019-05-14 DIAGNOSIS — Z860101 Personal history of adenomatous and serrated colon polyps: Secondary | ICD-10-CM

## 2019-05-14 DIAGNOSIS — L409 Psoriasis, unspecified: Secondary | ICD-10-CM

## 2019-05-14 DIAGNOSIS — E559 Vitamin D deficiency, unspecified: Secondary | ICD-10-CM | POA: Diagnosis not present

## 2019-05-14 DIAGNOSIS — E039 Hypothyroidism, unspecified: Secondary | ICD-10-CM

## 2019-05-14 DIAGNOSIS — N401 Enlarged prostate with lower urinary tract symptoms: Secondary | ICD-10-CM

## 2019-05-14 DIAGNOSIS — Z6841 Body Mass Index (BMI) 40.0 and over, adult: Secondary | ICD-10-CM

## 2019-05-14 DIAGNOSIS — E785 Hyperlipidemia, unspecified: Secondary | ICD-10-CM

## 2019-05-14 DIAGNOSIS — Z79899 Other long term (current) drug therapy: Secondary | ICD-10-CM

## 2019-05-14 DIAGNOSIS — Z86010 Personal history of colonic polyps: Secondary | ICD-10-CM

## 2019-05-14 NOTE — Patient Instructions (Signed)
We will contact you via MyChart with your lab results  Check your blood pressure twice a day and record this.  Goal for you is 140/90 or less.  Be sure that you are resting for 26min or so prior to checking your blood pressure.   IF your blood pressure is constantly 160/100 or higher please contact our office.  Aim for 7+ servings of fruits and vegetables daily   65+ fluid ounces of water for healthy kidneys   Limit animal fats in diet for cholesterol and heart health - choose grass fed whenever available   Aim for low stress - take time to unwind and care for your mental health   Aim for 150 min of moderate intensity exercise weekly for heart health, and weights twice weekly for bone health   Aim for 7-9 hours of sleep daily          When it comes to diets, agreement about the perfect plan isn't easy to find, even among the experts. Experts at the [employer] developed an idea known as the Healthy Eating Plate. Just imagine a plate divided into logical, healthy portions.   The emphasis is on diet quality:   Load up on vegetables and fruits - one-half of your plate: Aim for color and variety, and remember that white potatoes are high starch and should be consumed in moderation.     Go for whole grains - one-quarter of your plate: Whole wheat, barley, wheat berries, quinoa, oats, brown rice, and foods made with them. If you want pasta, go with whole wheat pasta as serving sized noted on package.   Protein power - one-quarter of your plate: Fish, chicken, beans, and nuts are all healthy, versatile protein sources. Limit red meat.   The diet, however, does go beyond the plate, offering a few other suggestions.   Use healthy plant oils, such as olive, canola, soy, corn, sunflower and peanut. Check the labels, and avoid partially hydrogenated oil, which have unhealthy trans fats.  This will help your cholesterol and heart health.   If you're thirsty, drink  water.  Aim for 80 oz of water a day.  Coffee and tea are ok in moderation, but skip sugary drinks (juices, sodas, sweet tea) and limit milk and dairy products to one or two daily servings.   The type of carbohydrate in the diet is more important than the amount. Some sources of carbohydrates, such as vegetables, fruits, whole grains, and beans--are healthier than others.  If you are tech-savvy, check out [employer] for tips for replacing current unhealthy food choices, eating healthy on a budget, recipes and more.   Finally, stay active! Walking is an activity, involve family and friends.  It is FREE and maintains and or improves your health.   8 Critical Weight-Loss Tips That Aren't Diet and Exercise  1. STARVE THE DISTRACTIONS  All too often when we eat, we're also multitasking: watching TV, answering emails, scrolling through social media. These habits are detrimental to having a strong, clear, healthy relationship with food, and they can hinder our ability to make dietary changes.  In order to truly focus on what you're eating, how much you're eating, why you're eating those specific foods and, most importantly, how those foods make you feel, you need to starve the distractions. That means when you eat, just eat. Focus on your food, the process it went through to end up on your plate, where it came from and how  it nourishes you. With this technique, you're more likely to finish a meal feeling satiated.  2.  CONSIDER WHAT YOU'RE NOT WILLING TO DO  This might sound counterintuitive, but it can help provide a "why" when motivation is waning. Declare, in writing, what you are unwilling to do, for example "I am unwilling to be the old dad who cannot play sports with my children".  So consider what you're not willing to accept, write it down, and keep it at the ready.  3.  STOP LABELING FOOD "GOOD" AND "BAD"  You've probably heard someone say they ate something "bad." Maybe you've  even said it yourself.  The trouble with 'bad' foods isn't that they'll send you to the grave after a bite or two. The trouble comes when we eat excessive portions of really calorie-dense foods meal after meal, day after day.  Instead of labeling foods as good or bad, think about which foods you can eat a lot of, and which ones you should just eat a little of. Then, plan ways to eat the foods you really like in portions that fit with your overall goals. A good example of this would be having a slice of pizza alongside a club salad with chicken breast, avocado and a bit of dressing. This is vastly different than 3 slices of pizza, 4 breadsticks with cheese sauce and half of a liter of regular soda.  4.  BRUSH YOUR TEETH AFTER YOU EAT  Getting your mindset in order is important, but sometimes small habits can make a big difference. After eating, you still have the taste of food in their mouth, which often causes people to eat more even if they are full or engage in a nibble or two of dessert.  Brushing your teeth will remove the taste of food from your mouth, and the clean, minty freshness will serve as a cue that mealtime is over.  5.  FOCUS ON CROWDING NOT CUTTING  The most common first step during 'dieting' is to cut. We cut our portion sizes down, we cut out 'bad' foods, we cut out entire food groups. This act of cutting puts [place] and our minds into scarcity mode.  When something is off-limits, even if you're able to avoid it for a while, you could end up bingeing on it later because you've gone so long without it. So, instead of cutting, focus on crowding. If you crowd your plate and fill it up with more foods like veggies and protein, it simply allows less room for the other stuff. In other words, shift your focus away from what you can't eat, and celebrate the foods that will help you reach your goals.  6.  TAKE TRACKING A STEP FURTHER  Track what you eat, when you ate it, how much you ate and  how that food made you feel. Being completely honest with yourself and writing down every single thing that passes through your lips will help you start to notice that maybe you actually do snack, possibly take in more sugar than you thought, eat when you're bored rather than just hungry or maybe that you have a habit of snacking before bed while watching TV.  The difference from simply tracking your food intake is you're taking into account how food makes you feel, as well as what you're doing while you're eating. This is about becoming more mindful of what, when and why you eat.  7.  PRIORITIZE GOOD SLEEP  One of the strongest  risk factors for being overweight is poor sleep. When you're feeling tired, you're more likely to choose unhealthy comfort foods and to skip your workout. Additionally, sleep deprivation may slow down your metabolism. [place] [employer]! Therefore, sleeping 7-8 hours per night can help with weight loss without having to change your diet or increase your physical activity. And if you feel you snore and still wake up tired, talk with me about sleep apnea.  8.  SET ASIDE TIME TO DISCONNECT  Just get out there. Disconnect from the electronics and connect to the elements. Not only will this help reduce stress (a major factor in weight gain) by giving your mind a break from the constant stimulation we've all become so accustomed to, but it may also reprogram your brain to connect with yourself and what you're feeling.  Ways to cut 100 calories  1. Eat your eggs with hot sauce OR salsa instead of cheese.  Eggs are great for breakfast, but many people consider eggs and cheese to be BFFs. Instead of cheese--1 oz. of cheddar has 114 calories--top your eggs with hot sauce, which contains no calories and helps with satiety and metabolism. Salsa is also a great option!!  2. Top your toast, waffles or pancakes with mashed berries instead of jelly or syrup. Half a cup of berries--fresh, frozen or  thawed--has about 40 calories, compared with 2 tbsp. of maple syrup or jelly, which both have about 100 calories. The berries will also give you a good punch of fiber, which helps keep you full and satisfied and won't spike blood sugar quickly like the jelly or syrup. 3. Swap the non-fat latte for black coffee with a splash of half-and-half. Contrary to its name, that non-fat latte has 130 calories and a startling 19g of carbohydrates per 16 oz. serving. Replacing that 'light' drinkable dessert with a black coffee with a splash of half-and-half saves you more than 100 calories per 16 oz. serving. 4. Sprinkle salads with freeze-dried raspberries instead of dried cranberries. If you want a sweet addition to your nutritious salad, stay away from dried cranberries. They have a whopping 130 calories per  cup and 30g carbohydrates. Instead, sprinkle freeze-dried raspberries guilt-free and save more than 100 calories per  cup serving, adding 3g of belly-filling fiber. 5. Go for mustard in place of mayo on your sandwich. Mustard can add really nice flavor to any sandwich, and there are tons of varieties, from spicy to honey. A serving of mayo is 95 calories, versus 10 calories in a serving of mustard. 6. Choose a DIY salad dressing instead of the store-bought kind. Mix Dijon or whole grain mustard with low-fat Kefir or red wine vinegar and garlic. 7. Use hummus as a spread instead of a dip. Use hummus as a spread on a high-fiber cracker or tortilla with a sandwich and save on calories without sacrificing taste. 8. Pick just one salad "accessory." Salad isn't automatically a calorie winner. It's easy to over-accessorize with toppings. Instead of topping your salad with nuts, avocado and cranberries (all three will clock in at 313 calories), just pick one. The next day, choose a different accessory, which will also keep your salad interesting. You don't wear all your jewelry every day, right? 9. Ditch the white  pasta in favor of spaghetti squash. One cup of cooked spaghetti squash has about 40 calories, compared with traditional spaghetti, which comes with more than 200. Spaghetti squash is also nutrient-dense. It's a good source of fiber and Vitamins A and  C, and it can be eaten just like you would eat pasta--with a great tomato sauce and [place] meatballs or with pesto, tofu and spinach, for example. 10. Dress up your chili, soups and stews with non-fat [place] yogurt instead of sour cream. Just a 'dollop' of sour cream can set you back 115 calories and a whopping 12g of fat--seven of which are of the artery-clogging variety. Added bonus: [place] yogurt is packed with muscle-building protein, calcium and B Vitamins. 11. Mash cauliflower instead of mashed potatoes. One cup of traditional mashed potatoes--in all their creamy goodness--has more than 200 calories, compared to mashed cauliflower, which you can typically eat for less than 100 calories per 1 cup serving. Cauliflower is a great source of the antioxidant indole-3-carbinol (I3C), which may help reduce the risk of some cancers, like breast cancer. 12. Ditch the ice cream sundae in favor of a [place] yogurt parfait. Instead of a cup of ice cream or fro-yo for dessert, try 1 cup of nonfat Greek yogurt topped with fresh berries and a sprinkle of cacao nibs. Both toppings are packed with antioxidants, which can help reduce cellular inflammation and oxidative damage. And the comparison is a no-brainer: One cup of ice cream has about 275 calories; one cup of frozen yogurt has about 230; and a cup of Greek yogurt has just 130, plus twice the protein, so you're less likely to return to the freezer for a second helping. 13. Put olive oil in a spray container instead of using it directly from the bottle. Each tablespoon of olive oil is 120 calories and 15g of fat. Use a mister instead of pouring it straight into the pan or onto a salad. This allows for portion control and  will save you more than 100 calories. 14. When baking, substitute canned pumpkin for butter or oil. Canned pumpkin--not pumpkin pie mix--is loaded with Vitamin A, which is important for skin and eye health, as well as immunity. And the comparisons are pretty crazy:  cup of canned pumpkin has about 40 calories, compared to butter or oil, which has more than 800 calories. Yes, 800 calories. Applesauce and mashed banana can also serve as good substitutions for butter or oil, usually in a 1:1 ratio. 15. Top casseroles with high-fiber cereal instead of breadcrumbs. Breadcrumbs are typically made with white bread, while breakfast cereals contain 5-9g of fiber per serving. Not only will you save more than 150 calories per  cup serving, the swap will also keep you more full and you'll get a metabolism boost from the added fiber. 16. Snack on pistachios instead of macadamia nuts. Believe it or not, you get the same amount of calories from 35 pistachios (100 calories) as you would from only five macadamia nuts. 17. Chow down on kale chips rather than potato chips. This is my favorite 'don't knock it 'till you try it' swap. Kale chips are so easy to make at home, and you can spice them up with a little grated parmesan or chili powder. Plus, they're a mere fraction of the calories of potato chips, but with the same crunch factor we crave so often. 18. Add seltzer and some fruit slices to your cocktail instead of soda or fruit juice. One cup of soda or fruit juice can pack on as much as 140 calories. Instead, use seltzer and fruit slices. The fruit provides valuable phytochemicals, such as flavonoids and anthocyanins, which help to combat cancer and stave off the aging process.

## 2019-05-14 NOTE — Progress Notes (Signed)
Assessment and Plan:  LABS DONE AT [facility]-  Sent with hand written Rx CBC, CMP,Lipid panel, TSH, Vitamin D  [name] was seen today for 6 month follow up. Diagnoses and all orders for this visit:  Essential hypertension Elevated today Check blood pressure at home BID and record Continue current medications: Benicar HCT 40-25mg  daily in morning Monitor blood pressure at home; call if consistently over 130/80 Continue DASH diet.   Reminder to go to the ER if any CP, SOB, nausea, dizziness, severe HA, changes vision/speech, left arm numbness and tingling and jaw pain. Follow up in two weeks Contact office if B/P 160/100 Goal 140/90 or less [name] to appointment today Admits diet has been poor up 14lbs since last OV.  Hyperlipidemia, unspecified hyperlipidemia type No medication at this time Discussed dietary and exercise modifications  Hypothyroidism, unspecified type Taking levothyroxine 191mcg whole tablet daily Reminder to take on an empty stomach 30-37mins before first meal of the day. No antacid medications for 4 hours. -TSH  Vitamin D deficiency Taking 6,000IU daily -Vit D  Medication management Continued  Morbid Obesity  BMI 40.0-44.9 (41.3) Discussed dietary and exercise modifications Walks 4/7 days a week Increase vegetables and decrease carbohydrates. Discussed decreasing portion sizes.  Abnormal glucose Discussed dietary and exercise modifications  BPH associated with nocturia Taking Cardura 4mg  daily Wakes 2-3 times a night, no changes in this Continue to monitor  Hx of adenomatous colonic polyps Colonoscopy Q3 years, due 2022  Psorisis -     triamcinolone ointment (KENALOG) 0.1 %; Apply 1 application topically 2 (two) times daily.   Follow up in 6 months.  [facility]S DONE [facility] will have drawn 05/15/19 Discussed med's effects and SE's. Screening labs and tests as requested with regular follow-up as recommended.  5min time spent face to  face interview, examination, chart review, orders and recommendations. Continue medications as prescribed until labs result.   HPI  Julian Lee is 64 yo male presents for 63month follow up for HTN, HLD, hypothyroidism, weight, vitamin D and psoriasis.  He has history of adenomatous colonic polyps and next colonoscopy due 2022.  He is asymptomatic.   He walk 1.72mile every morning.  He was shocked at his weigh today.   He has had elevated blood pressure for over 15 + years. Reports he has not been checking his blood pressure at home  His blood pressure is not checked at home, he is on Olmesartan 40/HCTZ25mg , today their BP is BP: (!) 134/104 .  Manual recheck was 140/100.  He does not have kidney disease and last GFR was: Lab Results  Component Value Date   GFRNONAA 69 11/13/2018   He does workout, by means of walking 1.64miles a day 5 days a week.  Reports he used to ride the bicycle at lunch time at work but the gym has not been available during [place].  He denies chest pain, shortness of breath, dizziness.  He has history of eczema on his hands, uses triamcinolone with good resolve. Marland Kitchen  He is not on cholesterol medication and denies myalgias. His cholesterol is at goal. The cholesterol last visit was:   Lab Results  Component Value Date   CHOL 191 11/13/2018   HDL 47 11/13/2018   LDLCALC 123 (H) 11/13/2018   TRIG 114 11/13/2018   CHOLHDL 2.9 04/07/2014    Last A1C in the office was:  Lab Results  Component Value Date   HGBA1C 5.3 11/13/2018   Patient is on Vitamin  D supplement.   Lab Results  Component Value Date   VD25OH 74.6 11/13/2018     He is on thyroid medication. His medication was not changed last visit. Patient denies nervousness, palpitations and weight changes.  Lab Results  Component Value Date   TSH 0.953 11/13/2018   BMI is Body mass index is 41.37 kg/m., he is working on diet and exercise.  He is up 14lbs since last OV and knows he needs to make some  dietary changes.  Wt Readings from Last 3 Encounters:  05/14/19 (!) 313 lb 9.6 oz (142.2 kg)  11/12/18 299 lb (135.6 kg)  05/14/18 298 lb 4.8 oz (135.3 kg)   Lab Results  Component Value Date   PSA 0.70 10/01/2013   Reports he gets up 2-3 times a night to use the bathroom.  He reports no changes in this.  Current Medications:  Current Outpatient Medications on File Prior to Visit  Medication Sig Dispense Refill  . BABY ASPIRIN PO Take 81 mg by mouth daily.    . Cholecalciferol (VITAMIN D PO) Take 2,000 Units by mouth 3 (three) times daily.     Marland Kitchen doxazosin (CARDURA) 4 MG tablet Take 1 tablet at Bedtime for BP & Prostate 90 tablet 3  . Flaxseed, Linseed, (FLAX SEED OIL PO) Take by mouth daily. Takes 2 caps daily    . levothyroxine (SYNTHROID) 175 MCG tablet Take 1 tablet daily on an empty stomach with only water for 30 minutes & no Antacid meds, Calcium or Magnesium for 4 hours & avoid Biotin 90 tablet 1  . olmesartan-hydrochlorothiazide (BENICAR HCT) 40-25 MG tablet Take 1 tablet Daily for BP 90 tablet 3  . triamcinolone cream (KENALOG) 0.5 % Apply 1 application topically 2 (two) times daily. 80 g 2  . zinc gluconate 50 MG tablet Take 50 mg by mouth daily.     No current facility-administered medications on file prior to visit.   Health Maintenance:  Immunization History  Administered Date(s) Administered  . Influenza Inj Mdck Quad With Preservative 11/08/2016, 11/07/2017  . Influenza Split 09/26/2012  . Influenza, High Dose Seasonal PF 11/12/2018  . Influenza-Unspecified 11/07/2016, 11/07/2017  . PFIZER SARS-COV-2 Vaccination 04/04/2019, 04/29/2019  . PPD Test 04/01/2013  . Pneumococcal Conjugate-13 11/12/2018  . Pneumococcal Polysaccharide-23 09/16/2008  . Td 08/22/2006  . Tdap 11/08/2016  . Zoster 01/10/2006   Tetanus: 2018 Pneumovax: 2010 Prevnar 13: 11/2018 Flu vaccine: 11/2018 Zostavax: 2008   CXR 2016 Colonoscopy:2019 recall 2022 3 adenomas, Dr.  Carlean Purl Cardiolite negative 2009 DEE: 6 years ago- has not had.    Eye Exam: Due for 2021 Dental Exam: 03/2019  Allergies:  Allergies  Allergen Reactions  . Latex      Medical History:  Past Medical History:  Diagnosis Date  . Allergy   . Graves disease    pt. had radiation and is now on Thyroid medication   . Hx of adenomatous colonic polyps 02/16/2017  . Hyperlipidemia    not on meds  only LDL in the past  . Hypertension   . Thyroid disease   . Vitamin D deficiency    SURGICAL HISTORY He  has a past surgical history that includes Tonsillectomy and adenoidectomy. FAMILY HISTORY His family history includes Cancer in his mother; Heart attack in his father; Rheum arthritis in his mother. SOCIAL HISTORY He  reports that he quit smoking about 14 years ago. He quit smokeless tobacco use about 29 years ago. He reports current alcohol use. He reports  that he does not use drugs.   Review of Systems  Constitutional: Negative.   HENT: Negative.   Eyes: Negative.   Respiratory: Negative.   Cardiovascular: Negative.   Gastrointestinal: Negative.   Genitourinary: Negative.        Nocturia x 2, mild BPH symptoms, declines medications, no change  Musculoskeletal: Negative.   Skin: Negative.   Neurological: Negative.   Endo/Heme/Allergies: Negative.   Psychiatric/Behavioral: Negative.      Physical Exam: Estimated body mass index is 41.37 kg/m as calculated from the following:   Height as of this encounter: 6\' 1"  (1.854 m).   Weight as of this encounter: 313 lb 9.6 oz (142.2 kg). BP (!) 134/104   Pulse 89   Temp (!) 96.1 F (35.6 C)   Resp 16   Ht 6\' 1"  (1.854 m)   Wt (!) 313 lb 9.6 oz (142.2 kg)   SpO2 99%   BMI 41.37 kg/m  General Appearance: Well nourished, in no apparent distress. Eyes: PERRLA, EOMs, conjunctiva no swelling or erythema, normal fundi and vessels. Sinuses: No Frontal/maxillary tenderness ENT/Mouth: Ext aud canals clear, normal light reflex with  TMs without erythema, bulging. Good dentition. No erythema, swelling, or exudate on post pharynx. Tonsils not swollen or erythematous. Hearing normal.  Neck: Supple, thyroid normal. No bruits Respiratory: Respiratory effort normal, BS equal bilaterally without rales, rhonchi, wheezing or stridor. Cardio: RRR without murmurs, rubs or gallops. Brisk peripheral pulses without edema.  Chest: symmetric, with normal excursions and percussion. Abdomen: Soft, +BS, obese. Non tender, no guarding, rebound, hernias, masses, or organomegaly.  Lymphatics: Non tender without lymphadenopathy.  Genitourinary: defer Musculoskeletal: Full ROM all peripheral extremities,5/5 strength, and normal gait. Skin: Warm, dry without rashes, lesions, ecchymosis. Dry patch noted to right lateral neck. Neuro: Cranial nerves intact, reflexes equal bilaterally. Normal muscle tone, no cerebellar symptoms. Sensation intact.  Psych: Awake and oriented X 3, normal affect, Insight and Judgment appropriate.    Garnet Sierras, NP [facility] 05/14/2019  12:12 PM

## 2019-05-15 ENCOUNTER — Other Ambulatory Visit: Payer: Self-pay | Admitting: Adult Health Nurse Practitioner

## 2019-05-16 LAB — COMPREHENSIVE METABOLIC PANEL
ALT: 30 IU/L (ref 0–44)
AST: 34 IU/L (ref 0–40)
Albumin/Globulin Ratio: 1.6 (ref 1.2–2.2)
Albumin: 4.4 g/dL (ref 3.8–4.8)
Alkaline Phosphatase: 92 IU/L (ref 39–117)
BUN/Creatinine Ratio: 12 (ref 10–24)
BUN: 14 mg/dL (ref 8–27)
Bilirubin Total: 0.5 mg/dL (ref 0.0–1.2)
CO2: 25 mmol/L (ref 20–29)
Calcium: 9.9 mg/dL (ref 8.6–10.2)
Chloride: 101 mmol/L (ref 96–106)
Creatinine, Ser: 1.18 mg/dL (ref 0.76–1.27)
GFR calc Af Amer: 74 mL/min/{1.73_m2} (ref >59)
GFR calc non Af Amer: 64 mL/min/{1.73_m2} (ref >59)
Globulin, Total: 2.7 g/dL (ref 1.5–4.5)
Glucose: 101 mg/dL — ABNORMAL HIGH (ref 65–99)
Potassium: 4.5 mmol/L (ref 3.5–5.2)
Sodium: 140 mmol/L (ref 134–144)
Total Protein: 7.1 g/dL (ref 6.0–8.5)

## 2019-05-16 LAB — LIPID PANEL W/O CHOL/HDL RATIO
Cholesterol, Total: 179 mg/dL (ref 100–199)
HDL: 45 mg/dL (ref >39)
LDL Chol Calc (NIH): 113 mg/dL — ABNORMAL HIGH (ref 0–99)
Triglycerides: 116 mg/dL (ref 0–149)
VLDL Cholesterol Cal: 21 mg/dL (ref 5–40)

## 2019-05-16 LAB — CBC WITH DIFFERENTIAL/PLATELET
Basophils Absolute: 0.1 10*3/uL (ref 0.0–0.2)
Basos: 1 %
EOS (ABSOLUTE): 0.4 10*3/uL (ref 0.0–0.4)
Eos: 7 %
Hematocrit: 50.8 % (ref 37.5–51.0)
Hemoglobin: 16.7 g/dL (ref 13.0–17.7)
Immature Grans (Abs): 0 10*3/uL (ref 0.0–0.1)
Immature Granulocytes: 0 %
Lymphocytes Absolute: 1 10*3/uL (ref 0.7–3.1)
Lymphs: 19 %
MCH: 29.8 pg (ref 26.6–33.0)
MCHC: 32.9 g/dL (ref 31.5–35.7)
MCV: 91 fL (ref 79–97)
Monocytes Absolute: 0.5 10*3/uL (ref 0.1–0.9)
Monocytes: 11 %
Neutrophils Absolute: 3 10*3/uL (ref 1.4–7.0)
Neutrophils: 62 %
Platelets: 201 10*3/uL (ref 150–450)
RBC: 5.6 x10E6/uL (ref 4.14–5.80)
RDW: 12.4 % (ref 11.6–15.4)
WBC: 5 10*3/uL (ref 3.4–10.8)

## 2019-05-16 LAB — HGB A1C W/O EAG: Hgb A1c MFr Bld: 5.3 % (ref 4.8–5.6)

## 2019-05-16 LAB — VITAMIN D 25 HYDROXY (VIT D DEFICIENCY, FRACTURES): Vit D, 25-Hydroxy: 84.9 ng/mL (ref 30.0–100.0)

## 2019-05-28 ENCOUNTER — Encounter: Payer: Self-pay | Admitting: Adult Health Nurse Practitioner

## 2019-05-28 ENCOUNTER — Other Ambulatory Visit: Payer: Self-pay

## 2019-05-28 ENCOUNTER — Ambulatory Visit: Payer: 59 | Admitting: Adult Health Nurse Practitioner

## 2019-05-28 VITALS — BP 138/90 | HR 67 | Temp 97.6°F | Ht 73.0 in | Wt 305.0 lb

## 2019-05-28 DIAGNOSIS — R7309 Other abnormal glucose: Secondary | ICD-10-CM | POA: Diagnosis not present

## 2019-05-28 DIAGNOSIS — I10 Essential (primary) hypertension: Secondary | ICD-10-CM

## 2019-05-28 DIAGNOSIS — E785 Hyperlipidemia, unspecified: Secondary | ICD-10-CM

## 2019-05-28 DIAGNOSIS — Z6841 Body Mass Index (BMI) 40.0 and over, adult: Secondary | ICD-10-CM | POA: Diagnosis not present

## 2019-06-05 NOTE — Progress Notes (Signed)
Assessment and Plan:  Diagnoses and all orders for this visit:  Essential hypertension Continue current medications:Benicar 40-25mg  daily in am Monitor blood pressure at home; call if consistently over 130/80 Continue DASH diet.   Reminder to go to the ER if any CP, SOB, nausea, dizziness, severe HA, changes vision/speech, left arm numbness and tingling and jaw pain.  Hyperlipidemia, unspecified hyperlipidemia type Continue medications: Discussed dietary and exercise modifications Low fat diet  Abnormal glucose Discussed dietary and exercise modifications Continue diet portion and choice modification  BMI 40.0-44.9, adult (HCC) Improve from BMI 41 to 40 Close follow up in one month Will continue to monitor       Further disposition pending results of labs. Discussed med's effects and SE's.   Over 30 minutes of exam, counseling, chart review, and critical decision making was performed.   Future Appointments  Date Time Provider [place]  09/03/2019  4:30 PM Garnet Sierras, NP [facility] None  12/04/2019 10:00 AM Vicie Mutters, PA-C [facility] None    ------------------------------------------------------------------------------------------------------------------   HPI 64 y.o.male presents for evaluation of blood pressure.  Last OV was 05/14/19 with hypertension uncontrolled in office.  Patient recorded home blood pressures over the course of the past two weeks and brought log to appointment today.  His blood pressure range from 140--127 over 89-76 with pulse rang 89-65. Last OV discussed dietary and exercise modifications as his weight has been increasing from 280 to 313lbs today over the course of the past two years.  In the last two weeks the patient has dramatically changed his diet.  Reports he has decreased portions and been making healthier food options.  He was walking but has increased this and been more consistent and walks 5 miles a day.  He is down  8lbs!  Discussed healthy weight loss of 2-5 lbs a week, initially may be more when starting.  Encouraged his healthy behaviors and congratulated his successes.  Discussed close follow up to help with motivation.  Discussed considering a food log, to bring to appointments for review if having difficultly with food choices.  Past Medical History:  Diagnosis Date  . Allergy   . Graves disease    pt. had radiation and is now on Thyroid medication   . Hx of adenomatous colonic polyps 02/16/2017  . Hyperlipidemia    not on meds  only LDL in the past  . Hypertension   . Thyroid disease   . Vitamin D deficiency      Allergies  Allergen Reactions  . Latex     Current Outpatient Medications on File Prior to Visit  Medication Sig  . BABY ASPIRIN PO Take 81 mg by mouth daily.  . Cholecalciferol (VITAMIN D PO) Take 2,000 Units by mouth 3 (three) times daily.   Marland Kitchen doxazosin (CARDURA) 4 MG tablet Take 1 tablet at Bedtime for BP & Prostate  . Flaxseed, Linseed, (FLAX SEED OIL PO) Take by mouth daily. Takes 2 caps daily  . levothyroxine (SYNTHROID) 175 MCG tablet Take 1 tablet daily on an empty stomach with only water for 30 minutes & no Antacid meds, Calcium or Magnesium for 4 hours & avoid Biotin  . olmesartan-hydrochlorothiazide (BENICAR HCT) 40-25 MG tablet Take 1 tablet Daily for BP  . triamcinolone cream (KENALOG) 0.5 % Apply 1 application topically 2 (two) times daily.  Marland Kitchen zinc gluconate 50 MG tablet Take 50 mg by mouth daily.   No current facility-administered medications on file prior to visit.    ROS: all  negative except above.   Physical Exam:  BP 138/90   Pulse 67   Temp 97.6 F (36.4 C)   Ht 6\' 1"  (1.854 m)   Wt (!) 305 lb (138.3 kg)   SpO2 99%   BMI 40.24 kg/m   General Appearance: Well nourished, in no apparent distress. Eyes: PERRLA, EOMs, conjunctiva no swelling or erythema ENT/Mouth: Ext aud canals clear, TMs without erythema, bulging. No erythema, swelling, or exudate  on post pharynx.  Tonsils not swollen or erythematous. Hearing normal.  Respiratory: Respiratory effort normal, BS equal bilaterally without rales, rhonchi, wheezing or stridor.  Cardio: RRR with no MRGs. Brisk peripheral pulses without edema.  Abdomen: Soft, + BS.  Non tender, no guarding, rebound, hernias, masses. Musculoskeletal: Full ROM, 5/5 strength, normal gait.  Skin: Warm, dry without rashes, lesions, ecchymosis.  Neuro: Cranial nerves intact. Normal muscle tone, no cerebellar symptoms. Sensation intact.  Psych: Awake and oriented X 3, normal affect, Insight and Judgment appropriate.     Garnet Sierras, NP 1:44 PM [facility]

## 2019-07-21 ENCOUNTER — Other Ambulatory Visit: Payer: Self-pay | Admitting: Internal Medicine

## 2019-07-21 DIAGNOSIS — E039 Hypothyroidism, unspecified: Secondary | ICD-10-CM

## 2019-09-01 NOTE — Progress Notes (Signed)
Assessment and Plan:  [facility]-  Sent with hand written Rx CBC, CMP,Lipid panel, TSH, Vitamin D  Julian Lee was seen today for 6 month follow up. Diagnoses and all orders for this visit:  Essential hypertension Continue to monitor blood pressure Continue current medications: Benicar HCT 40-25mg  daily in morning Monitor blood pressure at home; call if consistently over 130/80 Continue DASH diet.   Reminder to go to the ER if any CP, SOB, nausea, dizziness, severe HA, changes vision/speech, left arm numbness and tingling and jaw pain. Follow up in two weeks Contact office if B/P 160/100 Goal 140/90 or less [name] to appointment today Admits diet has been poor up 14lbs since last OV.  Hyperlipidemia, unspecified hyperlipidemia type No medication at this time Discussed dietary and exercise modifications  Hypothyroidism, unspecified type Taking levothyroxine 146mcg whole tablet daily Reminder to take on an empty stomach 30-79mins before first meal of the day. No antacid medications for 4 hours. -TSH  Vitamin D deficiency Taking 6,000IU daily -Vit D  Medication management Continued  Morbid Obesity  BMI 40.0-44.9 (41.3) Down 39lbs since 05/2019 Discussed dietary and exercise modifications Walks 5/7 days a week Increase vegetables and decrease carbohydrates Discussed decreasing portion sizes Encouraged healthy behavior   Abnormal glucose Discussed dietary and exercise modifications  BPH associated with nocturia Doing well, continue with benefit Taking Cardura 4mg  daily Wakes 2-3 times a night, no changes in this Continue to monitor  Hx of adenomatous colonic polyps Colonoscopy Q3 years, due 2022  Psorisis Improved from last OV -     triamcinolone ointment (KENALOG) 0.1 %; Apply 1 application topically 2 (two) times daily.   Follow up in 6 months.  [facility] will have drawn 05/15/19 Discussed med's effects and SE's. Screening labs and  tests as requested with regular follow-up as recommended.  74min time spent face to face interview, examination, chart review, orders and recommendations. Continue medications as prescribed until labs result.   HPI  Julian Lee is 64 yo male presents for 95month follow up for HTN, HLD, hypothyroidism, weight, vitamin D and psoriasis.  He walk 1.65mile every morning.  He is down 39 lbs since 05/2019.  He has increased his activity as well as well as changed his diet.  He has decreased his portion sizes and has been choosing healthier options.  He has history of adenomatous colonic polyps and next colonoscopy due 2022.     He has had elevated blood pressure for over 15 + years.  His blood pressure he monitors at home and he has been checking related to weight loss.  Hid systolic ranges from 458-09 and diastolic 98-33 with pulse ranging from 87-64.  He is on Olmesartan 40/HCTZ25mg , today their BP is BP: 110/76 .  We discussed continuing to monitor this as his blood pressure may improve.   He does not have kidney disease and last GFR was: Lab Results  Component Value Date   GFRNONAA 74 09/04/2019   He does workout, by means of walking 1.4miles a day 5 days a week.  Reports he used to ride the bicycle at lunch time at work but the gym has not been available during [place].  He denies chest pain, shortness of breath, dizziness.  He has history of eczema on his hands, uses triamcinolone with good resolve. Marland Kitchen  He is not on cholesterol medication and denies myalgias. His cholesterol is at goal. The cholesterol last visit was:   Lab Results  Component Value Date   CHOL 167 09/04/2019   HDL 39 (L) 09/04/2019   LDLCALC 106 (H) 09/04/2019   TRIG 123 09/04/2019   CHOLHDL 2.9 04/07/2014    Last A1C in the office was:  Lab Results  Component Value Date   HGBA1C 5.3 05/15/2019   Patient is on Vitamin D supplement.   Lab Results  Component Value Date   VD25OH 84.9 05/15/2019     He is on  thyroid medication. His medication was not changed last visit. Patient denies nervousness, palpitations and weight changes.  Lab Results  Component Value Date   TSH 7.620 (H) 09/04/2019   BMI is Body mass index is 36.15 kg/m., he is working on diet and exercise.  He is up 14lbs since last OV and knows he needs to make some dietary changes.  Wt Readings from Last 3 Encounters:  09/03/19 274 lb (124.3 kg)  05/28/19 (!) 305 lb (138.3 kg)  05/14/19 (!) 313 lb 9.6 oz (142.2 kg)   Lab Results  Component Value Date   PSA 0.70 10/01/2013   Reports he gets up 2-3 times a night to use the bathroom.  He reports no changes in this.  Current Medications:  Current Outpatient Medications on File Prior to Visit  Medication Sig Dispense Refill  . BABY ASPIRIN PO Take 81 mg by mouth daily.    . Cholecalciferol (VITAMIN D PO) Take 2,000 Units by mouth 3 (three) times daily.     Marland Kitchen doxazosin (CARDURA) 4 MG tablet Take 1 tablet at Bedtime for BP & Prostate 90 tablet 3  . Flaxseed, Linseed, (FLAX SEED OIL PO) Take by mouth daily. Takes 2 caps daily    . levothyroxine (SYNTHROID) 175 MCG tablet TAKE 1 TAB DAILY ON EMPTY  STOMACH WITH ONLY WATER FOR 30 MIN AND NO ANTACID MEDS, CALCIUM OR MAGNESIUM FOR 4  HRS AND AVOID BIOTIN 90 tablet 3  . olmesartan-hydrochlorothiazide (BENICAR HCT) 40-25 MG tablet Take 1 tablet Daily for BP 90 tablet 3  . triamcinolone cream (KENALOG) 0.5 % Apply 1 application topically 2 (two) times daily. 80 g 2  . zinc gluconate 50 MG tablet Take 50 mg by mouth daily.     No current facility-administered medications on file prior to visit.   Health Maintenance:  Immunization History  Administered Date(s) Administered  . Influenza Inj Mdck Quad With Preservative 11/08/2016, 11/07/2017  . Influenza Split 09/26/2012  . Influenza, High Dose Seasonal PF 11/12/2018  . Influenza-Unspecified 11/07/2016, 11/07/2017  . PFIZER SARS-COV-2 Vaccination 04/04/2019, 04/29/2019  . PPD Test  04/01/2013  . Pneumococcal Conjugate-13 11/12/2018  . Pneumococcal Polysaccharide-23 09/16/2008  . Td 08/22/2006  . Tdap 11/08/2016  . Zoster 01/10/2006   Tetanus: 2018 Pneumovax: 2010 Prevnar 13: 11/2018 Flu vaccine: 11/2018 Zostavax: 2008   CXR 2016 Colonoscopy:2019 recall 2022 3 adenomas, Dr. Carlean Purl Cardiolite negative 2009 DEE: 6 years ago- has not had.    Eye Exam: Due for 2021 Dental Exam: 03/2019  Allergies:  Allergies  Allergen Reactions  . Latex      Medical History:  Past Medical History:  Diagnosis Date  . Allergy   . Graves disease    pt. had radiation and is now on Thyroid medication   . Hx of adenomatous colonic polyps 02/16/2017  . Hyperlipidemia    not on meds  only LDL in the past  . Hypertension   . Thyroid disease   . Vitamin D deficiency    SURGICAL HISTORY He  has  a past surgical history that includes Tonsillectomy and adenoidectomy. FAMILY HISTORY His family history includes Cancer in his mother; Heart attack in his father; Rheum arthritis in his mother. SOCIAL HISTORY He  reports that he quit smoking about 15 years ago. He quit smokeless tobacco use about 29 years ago. He reports current alcohol use. He reports that he does not use drugs.   Review of Systems  Constitutional: Negative for chills, diaphoresis, fever, malaise/fatigue and weight loss.  HENT: Negative for congestion, ear discharge, ear pain, hearing loss, nosebleeds, sinus pain, sore throat and tinnitus.   Eyes: Negative for blurred vision, double vision, photophobia, pain, discharge and redness.  Respiratory: Negative for cough, hemoptysis, sputum production, shortness of breath, wheezing and stridor.   Cardiovascular: Negative for chest pain, palpitations, orthopnea, claudication, leg swelling and PND.  Gastrointestinal: Negative for abdominal pain, blood in stool, constipation, diarrhea, heartburn, melena, nausea and vomiting.  Genitourinary: Negative for dysuria, flank  pain, frequency, hematuria and urgency.  Musculoskeletal: Negative for back pain, falls, joint pain, myalgias and neck pain.  Skin: Negative for itching and rash.  Neurological: Negative for dizziness, tingling, tremors, sensory change, speech change, focal weakness, seizures, loss of consciousness, weakness and headaches.  Endo/Heme/Allergies: Negative for environmental allergies and polydipsia. Does not bruise/bleed easily.  Psychiatric/Behavioral: Negative for depression, hallucinations, memory loss, substance abuse and suicidal ideas. The patient is not nervous/anxious and does not have insomnia.      Physical Exam: Estimated body mass index is 36.15 kg/m as calculated from the following:   Height as of this encounter: 6\' 1"  (1.854 m).   Weight as of this encounter: 274 lb (124.3 kg). BP 110/76   Pulse 74   Temp (!) 96.8 F (36 C)   Ht 6\' 1"  (1.854 m)   Wt 274 lb (124.3 kg)   SpO2 96%   BMI 36.15 kg/m  General Appearance: Well nourished, in no apparent distress. Eyes: PERRLA, EOMs, conjunctiva no swelling or erythema, normal fundi and vessels. Sinuses: No Frontal/maxillary tenderness ENT/Mouth: Ext aud canals clear, normal light reflex with TMs without erythema, bulging. Good dentition. No erythema, swelling, or exudate on post pharynx. Tonsils not swollen or erythematous. Hearing normal.  Neck: Supple, thyroid normal. No bruits Respiratory: Respiratory effort normal, BS equal bilaterally without rales, rhonchi, wheezing or stridor. Cardio: RRR without murmurs, rubs or gallops. Brisk peripheral pulses without edema.  Chest: symmetric, with normal excursions and percussion. Abdomen: Soft, +BS, obese. Non tender, no guarding, rebound, hernias, masses, or organomegaly.  Lymphatics: Non tender without lymphadenopathy.  Genitourinary: defer Musculoskeletal: Full ROM all peripheral extremities,5/5 strength, and normal gait. Skin: Warm, dry without rashes, lesions, ecchymosis. Dry  patch noted to right lateral neck. Neuro: Cranial nerves intact, reflexes equal bilaterally. Normal muscle tone, no cerebellar symptoms. Sensation intact.  Psych: Awake and oriented X 3, normal affect, Insight and Judgment appropriate.     Garnet Sierras, Laqueta Jean, DNP [facility] 09/12/2019  2:16 PM

## 2019-09-03 ENCOUNTER — Other Ambulatory Visit: Payer: Self-pay

## 2019-09-03 ENCOUNTER — Encounter: Payer: Self-pay | Admitting: Adult Health Nurse Practitioner

## 2019-09-03 ENCOUNTER — Ambulatory Visit: Payer: 59 | Admitting: Adult Health Nurse Practitioner

## 2019-09-03 VITALS — BP 110/76 | HR 74 | Temp 96.8°F | Ht 73.0 in | Wt 274.0 lb

## 2019-09-03 DIAGNOSIS — I10 Essential (primary) hypertension: Secondary | ICD-10-CM

## 2019-09-03 DIAGNOSIS — E559 Vitamin D deficiency, unspecified: Secondary | ICD-10-CM | POA: Diagnosis not present

## 2019-09-03 DIAGNOSIS — E785 Hyperlipidemia, unspecified: Secondary | ICD-10-CM | POA: Diagnosis not present

## 2019-09-03 DIAGNOSIS — Z79899 Other long term (current) drug therapy: Secondary | ICD-10-CM

## 2019-09-03 DIAGNOSIS — Z6841 Body Mass Index (BMI) 40.0 and over, adult: Secondary | ICD-10-CM

## 2019-09-03 DIAGNOSIS — L409 Psoriasis, unspecified: Secondary | ICD-10-CM

## 2019-09-03 DIAGNOSIS — N401 Enlarged prostate with lower urinary tract symptoms: Secondary | ICD-10-CM

## 2019-09-03 DIAGNOSIS — E039 Hypothyroidism, unspecified: Secondary | ICD-10-CM

## 2019-09-03 DIAGNOSIS — R7309 Other abnormal glucose: Secondary | ICD-10-CM

## 2019-09-03 DIAGNOSIS — R351 Nocturia: Secondary | ICD-10-CM

## 2019-09-04 ENCOUNTER — Other Ambulatory Visit: Payer: Self-pay | Admitting: Adult Health Nurse Practitioner

## 2019-09-05 LAB — CBC/DIFF AMBIGUOUS DEFAULT
Basophils Absolute: 0.1 10*3/uL (ref 0.0–0.2)
Basos: 1 %
EOS (ABSOLUTE): 0.2 10*3/uL (ref 0.0–0.4)
Eos: 4 %
Hematocrit: 46.7 % (ref 37.5–51.0)
Hemoglobin: 15.7 g/dL (ref 13.0–17.7)
Immature Grans (Abs): 0 10*3/uL (ref 0.0–0.1)
Immature Granulocytes: 0 %
Lymphocytes Absolute: 0.9 10*3/uL (ref 0.7–3.1)
Lymphs: 19 %
MCH: 30.4 pg (ref 26.6–33.0)
MCHC: 33.6 g/dL (ref 31.5–35.7)
MCV: 91 fL (ref 79–97)
Monocytes Absolute: 0.5 10*3/uL (ref 0.1–0.9)
Monocytes: 10 %
Neutrophils Absolute: 3.1 10*3/uL (ref 1.4–7.0)
Neutrophils: 66 %
Platelets: 188 10*3/uL (ref 150–450)
RBC: 5.16 x10E6/uL (ref 4.14–5.80)
RDW: 12.9 % (ref 11.6–15.4)
WBC: 4.8 10*3/uL (ref 3.4–10.8)

## 2019-09-05 LAB — COMPREHENSIVE METABOLIC PANEL
ALT: 19 IU/L (ref 0–44)
AST: 20 IU/L (ref 0–40)
Albumin/Globulin Ratio: 1.9 (ref 1.2–2.2)
Albumin: 4.4 g/dL (ref 3.8–4.8)
Alkaline Phosphatase: 96 IU/L (ref 48–121)
BUN/Creatinine Ratio: 13 (ref 10–24)
BUN: 14 mg/dL (ref 8–27)
Bilirubin Total: 0.5 mg/dL (ref 0.0–1.2)
CO2: 21 mmol/L (ref 20–29)
Calcium: 9.7 mg/dL (ref 8.6–10.2)
Chloride: 102 mmol/L (ref 96–106)
Creatinine, Ser: 1.05 mg/dL (ref 0.76–1.27)
GFR calc Af Amer: 85 mL/min/{1.73_m2} (ref >59)
GFR calc non Af Amer: 74 mL/min/{1.73_m2} (ref >59)
Globulin, Total: 2.3 g/dL (ref 1.5–4.5)
Glucose: 89 mg/dL (ref 65–99)
Potassium: 4.3 mmol/L (ref 3.5–5.2)
Sodium: 139 mmol/L (ref 134–144)
Total Protein: 6.7 g/dL (ref 6.0–8.5)

## 2019-09-05 LAB — LIPID PANEL W/O CHOL/HDL RATIO
Cholesterol, Total: 167 mg/dL (ref 100–199)
HDL: 39 mg/dL — ABNORMAL LOW (ref >39)
LDL Chol Calc (NIH): 106 mg/dL — ABNORMAL HIGH (ref 0–99)
Triglycerides: 123 mg/dL (ref 0–149)
VLDL Cholesterol Cal: 22 mg/dL (ref 5–40)

## 2019-09-05 LAB — TSH: TSH: 7.62 u[IU]/mL — ABNORMAL HIGH (ref 0.450–4.500)

## 2019-09-12 ENCOUNTER — Encounter: Payer: Self-pay | Admitting: Adult Health Nurse Practitioner

## 2019-10-08 ENCOUNTER — Other Ambulatory Visit: Payer: 59

## 2019-10-09 ENCOUNTER — Other Ambulatory Visit: Payer: Self-pay | Admitting: Adult Health Nurse Practitioner

## 2019-10-10 LAB — TSH: TSH: 3.59 u[IU]/mL (ref 0.450–4.500)

## 2019-11-13 ENCOUNTER — Encounter: Payer: 59 | Admitting: Physician Assistant

## 2019-11-27 ENCOUNTER — Encounter: Payer: Self-pay | Admitting: Adult Health Nurse Practitioner

## 2019-11-27 ENCOUNTER — Ambulatory Visit (INDEPENDENT_AMBULATORY_CARE_PROVIDER_SITE_OTHER): Payer: Medicare Other | Admitting: Adult Health Nurse Practitioner

## 2019-11-27 ENCOUNTER — Other Ambulatory Visit: Payer: Self-pay

## 2019-11-27 VITALS — BP 118/80 | HR 66 | Temp 97.3°F | Ht 73.0 in | Wt 250.0 lb

## 2019-11-27 DIAGNOSIS — E785 Hyperlipidemia, unspecified: Secondary | ICD-10-CM

## 2019-11-27 DIAGNOSIS — Z1389 Encounter for screening for other disorder: Secondary | ICD-10-CM

## 2019-11-27 DIAGNOSIS — R351 Nocturia: Secondary | ICD-10-CM

## 2019-11-27 DIAGNOSIS — E559 Vitamin D deficiency, unspecified: Secondary | ICD-10-CM

## 2019-11-27 DIAGNOSIS — E039 Hypothyroidism, unspecified: Secondary | ICD-10-CM | POA: Diagnosis not present

## 2019-11-27 DIAGNOSIS — Z23 Encounter for immunization: Secondary | ICD-10-CM

## 2019-11-27 DIAGNOSIS — I10 Essential (primary) hypertension: Secondary | ICD-10-CM

## 2019-11-27 DIAGNOSIS — Z136 Encounter for screening for cardiovascular disorders: Secondary | ICD-10-CM

## 2019-11-27 DIAGNOSIS — Z6832 Body mass index (BMI) 32.0-32.9, adult: Secondary | ICD-10-CM

## 2019-11-27 DIAGNOSIS — N401 Enlarged prostate with lower urinary tract symptoms: Secondary | ICD-10-CM

## 2019-11-27 DIAGNOSIS — R7309 Other abnormal glucose: Secondary | ICD-10-CM | POA: Diagnosis not present

## 2019-11-27 DIAGNOSIS — L409 Psoriasis, unspecified: Secondary | ICD-10-CM

## 2019-11-27 NOTE — Progress Notes (Signed)
COMPLETE PHYSICAL   Assessment and Plan: [facility]-  Sent with hand written Rx CBC, CMP, Mg, Lipid panel, TSH, Vitamin D, PSA, Urine Microalbumin/creatinin ratio.  [name] was seen today for annual exam.  Diagnoses and all orders for this visit:  Encounter for general adult medical examination with abnormal findings Yearly  Essential hypertension Continue current medications: Benicar HCT 40-25mg  daily in morning Monitor blood pressure at home; call if consistently over 130/80 Continue DASH diet.   Reminder to go to the ER if any CP, SOB, nausea, dizziness, severe HA, changes vision/speech, left arm numbness and tingling and jaw pain. -     EKG 12-Lead  Hyperlipidemia, unspecified hyperlipidemia type No medication at this time Discussed dietary and exercise modifications  Hypothyroidism, unspecified type Taking levothyroxine 159mcg daily Reminder to take on an empty stomach 30-62mins before first meal of the day. No antacid medications for 4 hours.  Vitamin D deficiency Taking 2,000IU daily  Medication management Continued  Class 2 severe obesity with serious comorbidity and body mass index (BMI) of 39.0 to 39.9 in adult, unspecified obesity type ([place]) Discussed dietary and exercise modifications Walks 4/7 days a week Increase vegetables and decrease carbohydrates.  Abnormal glucose Discussed dietary and exercise modifications  BPH associated with nocturia Taking Cardura 4mg  daily Wakes 3 times a night Continue to monitor  Hx of adenomatous colonic polyps Colonoscopy Q3 years, due 2022  Need for immunization against influenza Received today -     Flu vaccine HIGH DOSE PF  BMI 32.0-32.9 Improved, out of morbid obesity range Encouraged healthy behaviors Discussed dietary and exercise modifications  Psorisis Doing well at this time -     triamcinolone ointment (KENALOG) 0.1 %; Apply 1 application topically 2 (two) times daily.    Follow up in  3 months for Welcome to Medicare visit.   [facility]   Further disposition pending results if labs check today. Discussed med's effects and SE's.   Over 40 minutes of face to face interview, exam, counseling, chart review, and critical decision making was performed.   HPI Male 64 year old patient presents for complete physical.   He has HTN, HLD, Hypothyroidism, BPH, abnormal glucose, weight and Vitamin D deficiency.   He has had elevated blood pressure for over 15 + years. Reports he checks his blood pressure at home intermittently and it is in the normal range. His blood pressure is not checked at home, he is on Olmesartan 40/HCTZ25mg , today their BP is BP: 118/80 .  He has been keeping track of his blood pressures and weight.  He has been trending down both b/p and pulse. Denies any dizzines, headache, shortness of breath or chest pain.       Lab Results  Component Value Date   GFRNONAA 74 09/04/2019   He does workout and walks 3.65miles every day.  He splits this and walks twice a day. He denies chest pain, shortness of breath, dizziness.  He has history of eczema on his hands, uses triamcinolone.  He is not on cholesterol medication and denies myalgias. His cholesterol is at goal. The cholesterol last visit was:   Lab Results  Component Value Date   CHOL 167 09/04/2019   HDL 39 (L) 09/04/2019   LDLCALC 106 (H) 09/04/2019   TRIG 123 09/04/2019   CHOLHDL 2.9 04/07/2014    Last A1C in the office was:  Lab Results  Component Value Date   HGBA1C 5.3 05/15/2019  Patient is on Vitamin D supplement.   Lab Results  Component Value Date   VD25OH 84.9 05/15/2019     He is on thyroid medication. His medication was not changed last visit. Patient denies nervousness, palpitations and weight changes.  Lab Results  Component Value Date   TSH 3.590 10/09/2019   BMI is Body mass index is 32.98 kg/m., he is working on diet and exercise. He is down over 50lbs from May  of 2021.  He has been losing weight gradually 2lbs a week.  He has replaced rice with cauliflower and pasta for zucchini.  He reports he is drinking four 32ounce cups of water every day.  Wt Readings from Last 3 Encounters:  11/27/19 250 lb (113.4 kg)  09/03/19 274 lb (124.3 kg)  05/28/19 (!) 305 lb (138.3 kg)   Lab Results  Component Value Date   PSA 0.70 10/01/2013     Current Medications:  Current Outpatient Medications on File Prior to Visit  Medication Sig Dispense Refill  . BABY ASPIRIN PO Take 81 mg by mouth daily.    . Cholecalciferol (VITAMIN D PO) Take 2,000 Units by mouth 3 (three) times daily.     Marland Kitchen doxazosin (CARDURA) 4 MG tablet Take 1 tablet at Bedtime for BP & Prostate 90 tablet 3  . Flaxseed, Linseed, (FLAX SEED OIL PO) Take by mouth daily. Takes 2 caps daily    . levothyroxine (SYNTHROID) 175 MCG tablet TAKE 1 TAB DAILY ON EMPTY  STOMACH WITH ONLY WATER FOR 30 MIN AND NO ANTACID MEDS, CALCIUM OR MAGNESIUM FOR 4  HRS AND AVOID BIOTIN 90 tablet 3  . olmesartan-hydrochlorothiazide (BENICAR HCT) 40-25 MG tablet Take 1 tablet Daily for BP 90 tablet 3  . triamcinolone cream (KENALOG) 0.5 % Apply 1 application topically 2 (two) times daily. 80 g 2  . zinc gluconate 50 MG tablet Take 50 mg by mouth daily.     No current facility-administered medications on file prior to visit.   Health Maintenance:  Immunization History  Administered Date(s) Administered  . Influenza Inj Mdck Quad With Preservative 11/08/2016, 11/07/2017  . Influenza Split 09/26/2012  . Influenza, High Dose Seasonal PF 11/12/2018  . Influenza-Unspecified 11/07/2016, 11/07/2017  . PFIZER SARS-COV-2 Vaccination 04/04/2019, 04/29/2019  . PPD Test 04/01/2013  . Pneumococcal Conjugate-13 11/12/2018  . Pneumococcal Polysaccharide-23 09/16/2008  . Td 08/22/2006  . Tdap 11/08/2016  . Zoster 01/10/2006   Tetanus: 2018 Pneumovax: 2010 Prevnar 13: 11/2018 Flu vaccine: 11/2018 Zostavax: 2008  CXR  2016 Colonoscopy:2019 recall 2022 3 adenomas, Dr. Carlean Purl Cardiolite negative 2009 DEE: 6 years ago- has not had.   Eye Exam: Due Dental Exam: 09/2018  Allergies:  Allergies  Allergen Reactions  . Latex      Medical History:  Past Medical History:  Diagnosis Date  . Allergy   . Graves disease    pt. had radiation and is now on Thyroid medication   . Hx of adenomatous colonic polyps 02/16/2017  . Hyperlipidemia    not on meds  only LDL in the past  . Hypertension   . Thyroid disease   . Vitamin D deficiency    SURGICAL HISTORY He  has a past surgical history that includes Tonsillectomy and adenoidectomy. FAMILY HISTORY His family history includes Cancer in his mother; Heart attack in his father; Rheum arthritis in his mother. SOCIAL HISTORY He  reports that he quit smoking about 15 years ago. He quit smokeless tobacco use about 29 years ago. He reports  current alcohol use. He reports that he does not use drugs.   Review of Systems  Constitutional: Negative.   HENT: Negative.   Eyes: Negative.   Respiratory: Negative.   Cardiovascular: Negative.   Gastrointestinal: Negative.   Genitourinary: Negative.        Nocturia x 2, mild BPH symptoms, declines medications, no change  Musculoskeletal: Negative.   Skin: Negative.   Neurological: Negative.   Endo/Heme/Allergies: Negative.   Psychiatric/Behavioral: Negative.      Physical Exam: Estimated body mass index is 32.98 kg/m as calculated from the following:   Height as of this encounter: 6\' 1"  (1.854 m).   Weight as of this encounter: 250 lb (113.4 kg). BP 118/80   Pulse 66   Temp (!) 97.3 F (36.3 C)   Ht 6\' 1"  (1.854 m)   Wt 250 lb (113.4 kg)   SpO2 98%   BMI 32.98 kg/m    General Appearance: Well nourished, in no apparent distress. Eyes: PERRLA, EOMs, conjunctiva no swelling or erythema, normal fundi and vessels. Sinuses: No Frontal/maxillary tenderness ENT/Mouth: Ext aud canals clear, normal light  reflex with TMs without erythema, bulging. Good dentition. No erythema, swelling, or exudate on post pharynx. Tonsils not swollen or erythematous. Hearing normal.  Neck: Supple, thyroid normal. No bruits Respiratory: Respiratory effort normal, BS equal bilaterally without rales, rhonchi, wheezing or stridor. Cardio: RRR without murmurs, rubs or gallops. Brisk peripheral pulses without edema.  Chest: symmetric, with normal excursions and percussion. Abdomen: Soft, +BS, obese. Non tender, no guarding, rebound, hernias, masses, or organomegaly.  Lymphatics: Non tender without lymphadenopathy.  Genitourinary: defer Musculoskeletal: Full ROM all peripheral extremities,5/5 strength, and normal gait. Skin: Warm, dry without rashes, lesions, ecchymosis.  Neuro: Cranial nerves intact, reflexes equal bilaterally. Normal muscle tone, no cerebellar symptoms. Sensation intact.  Psych: Awake and oriented X 3, normal affect, Insight and Judgment appropriate.   EKG: NSR, No ST changes    Julian Males, DNP [facility] 11/27/2019  5:05 PM

## 2019-11-28 ENCOUNTER — Other Ambulatory Visit: Payer: Self-pay | Admitting: Adult Health Nurse Practitioner

## 2019-11-29 LAB — URINALYSIS, ROUTINE W REFLEX MICROSCOPIC
Bilirubin, UA: NEGATIVE
Glucose, UA: NEGATIVE
Ketones, UA: NEGATIVE
Leukocytes,UA: NEGATIVE
Nitrite, UA: NEGATIVE
Protein,UA: NEGATIVE
RBC, UA: NEGATIVE
Specific Gravity, UA: 1.019 (ref 1.005–1.030)
Urobilinogen, Ur: 0.2 mg/dL (ref 0.2–1.0)
pH, UA: 7.5 (ref 5.0–7.5)

## 2019-11-29 LAB — LIPID PANEL W/O CHOL/HDL RATIO
Cholesterol, Total: 169 mg/dL (ref 100–199)
HDL: 42 mg/dL (ref >39)
LDL Chol Calc (NIH): 109 mg/dL — ABNORMAL HIGH (ref 0–99)
Triglycerides: 96 mg/dL (ref 0–149)
VLDL Cholesterol Cal: 18 mg/dL (ref 5–40)

## 2019-11-29 LAB — CBC WITH DIFFERENTIAL/PLATELET
Basophils Absolute: 0.1 10*3/uL (ref 0.0–0.2)
Basos: 2 %
EOS (ABSOLUTE): 0.3 10*3/uL (ref 0.0–0.4)
Eos: 6 %
Hematocrit: 45.9 % (ref 37.5–51.0)
Hemoglobin: 15.2 g/dL (ref 13.0–17.7)
Immature Grans (Abs): 0 10*3/uL (ref 0.0–0.1)
Immature Granulocytes: 0 %
Lymphocytes Absolute: 0.9 10*3/uL (ref 0.7–3.1)
Lymphs: 20 %
MCH: 30.6 pg (ref 26.6–33.0)
MCHC: 33.1 g/dL (ref 31.5–35.7)
MCV: 92 fL (ref 79–97)
Monocytes Absolute: 0.6 10*3/uL (ref 0.1–0.9)
Monocytes: 13 %
Neutrophils Absolute: 2.7 10*3/uL (ref 1.4–7.0)
Neutrophils: 59 %
Platelets: 172 10*3/uL (ref 150–450)
RBC: 4.97 x10E6/uL (ref 4.14–5.80)
RDW: 12.3 % (ref 11.6–15.4)
WBC: 4.5 10*3/uL (ref 3.4–10.8)

## 2019-11-29 LAB — COMPREHENSIVE METABOLIC PANEL
ALT: 25 IU/L (ref 0–44)
AST: 29 IU/L (ref 0–40)
Albumin/Globulin Ratio: 2 (ref 1.2–2.2)
Albumin: 4.3 g/dL (ref 3.8–4.8)
Alkaline Phosphatase: 88 IU/L (ref 44–121)
BUN/Creatinine Ratio: 12 (ref 10–24)
BUN: 12 mg/dL (ref 8–27)
Bilirubin Total: 0.5 mg/dL (ref 0.0–1.2)
CO2: 24 mmol/L (ref 20–29)
Calcium: 9.6 mg/dL (ref 8.6–10.2)
Chloride: 102 mmol/L (ref 96–106)
Creatinine, Ser: 0.99 mg/dL (ref 0.76–1.27)
GFR calc Af Amer: 91 mL/min/{1.73_m2} (ref >59)
GFR calc non Af Amer: 79 mL/min/{1.73_m2} (ref >59)
Globulin, Total: 2.2 g/dL (ref 1.5–4.5)
Glucose: 89 mg/dL (ref 65–99)
Potassium: 4.7 mmol/L (ref 3.5–5.2)
Sodium: 139 mmol/L (ref 134–144)
Total Protein: 6.5 g/dL (ref 6.0–8.5)

## 2019-11-29 LAB — HGB A1C W/O EAG: Hgb A1c MFr Bld: 5.2 % (ref 4.8–5.6)

## 2019-11-29 LAB — MAGNESIUM: Magnesium: 2 mg/dL (ref 1.6–2.3)

## 2019-11-29 LAB — TSH: TSH: 1.26 u[IU]/mL (ref 0.450–4.500)

## 2019-11-29 LAB — VITAMIN D 25 HYDROXY (VIT D DEFICIENCY, FRACTURES): Vit D, 25-Hydroxy: 96.1 ng/mL (ref 30.0–100.0)

## 2019-11-29 LAB — MICROALBUMIN / CREATININE URINE RATIO
Creatinine, Urine: 124.2 mg/dL
Microalb/Creat Ratio: 5 mg/g creat (ref 0–29)
Microalbumin, Urine: 6.5 ug/mL

## 2019-11-29 LAB — PSA: Prostate Specific Ag, Serum: 0.8 ng/mL (ref 0.0–4.0)

## 2019-12-04 ENCOUNTER — Encounter: Payer: Medicare Other | Admitting: Adult Health Nurse Practitioner

## 2019-12-23 NOTE — Addendum Note (Signed)
Addended byGarnet Sierras A on: 12/23/2019 05:40 PM   Modules accepted: Orders

## 2020-01-22 ENCOUNTER — Other Ambulatory Visit: Payer: Self-pay

## 2020-01-22 MED ORDER — OLMESARTAN MEDOXOMIL-HCTZ 40-25 MG PO TABS
ORAL_TABLET | ORAL | 3 refills | Status: DC
Start: 2020-01-22 — End: 2022-03-03

## 2020-02-11 ENCOUNTER — Ambulatory Visit: Payer: Medicare HMO

## 2020-02-11 ENCOUNTER — Other Ambulatory Visit: Payer: Self-pay

## 2020-02-11 VITALS — Ht 73.0 in | Wt 233.0 lb

## 2020-02-11 DIAGNOSIS — Z86010 Personal history of colonic polyps: Secondary | ICD-10-CM

## 2020-02-11 MED ORDER — PLENVU 140 G PO SOLR: 1.0000 | ORAL | 0 refills | Status: DC

## 2020-02-11 NOTE — Progress Notes (Signed)
No allergies to soy or egg Pt is not on blood thinners or diet pills Denies issues with sedation/intubation Denies atrial flutter/fib Denies constipation   Emmi instructions given to pt  Pt is aware of Covid safety and care partner requirements.  

## 2020-02-19 ENCOUNTER — Encounter: Payer: Self-pay | Admitting: Internal Medicine

## 2020-02-25 ENCOUNTER — Other Ambulatory Visit: Payer: Self-pay

## 2020-02-25 ENCOUNTER — Encounter: Payer: Self-pay | Admitting: Internal Medicine

## 2020-02-25 ENCOUNTER — Ambulatory Visit (AMBULATORY_SURGERY_CENTER): Payer: Medicare HMO | Admitting: Internal Medicine

## 2020-02-25 VITALS — BP 114/67 | HR 60 | Temp 97.2°F | Resp 12 | Ht 73.0 in | Wt 233.0 lb

## 2020-02-25 DIAGNOSIS — K6389 Other specified diseases of intestine: Secondary | ICD-10-CM | POA: Diagnosis not present

## 2020-02-25 DIAGNOSIS — Z86010 Personal history of colonic polyps: Secondary | ICD-10-CM

## 2020-02-25 DIAGNOSIS — D123 Benign neoplasm of transverse colon: Secondary | ICD-10-CM

## 2020-02-25 DIAGNOSIS — K635 Polyp of colon: Secondary | ICD-10-CM | POA: Diagnosis not present

## 2020-02-25 MED ORDER — SODIUM CHLORIDE 0.9 % IV SOLN: 500.0000 mL | Freq: Once | INTRAVENOUS | Status: DC

## 2020-02-25 MED ORDER — NYSTATIN-TRIAMCINOLONE 100000-0.1 UNIT/GM-% EX OINT: 1.0000 "application " | TOPICAL_OINTMENT | Freq: Two times a day (BID) | CUTANEOUS | 1 refills | Status: DC

## 2020-02-25 NOTE — Op Note (Signed)
[place] Patient Name: Julian Lee Procedure Date: 02/25/2020 2:15 PM MRN: [id number] Endoscopist: Gatha Mayer , MD Age: 65 Referring MD:  Date of Birth: 1955/03/14 Gender: Male Account #: [id number] Procedure:                Colonoscopy Indications:              Surveillance: Personal history of adenomatous                            polyps on last colonoscopy 3 years ago Medicines:                Propofol per Anesthesia, Monitored Anesthesia Care Procedure:                Pre-Anesthesia Assessment:                           - Prior to the procedure, a History and Physical                            was performed, and patient medications and                            allergies were reviewed. The patient's tolerance of                            previous anesthesia was also reviewed. The risks                            and benefits of the procedure and the sedation                            options and risks were discussed with the patient.                            All questions were answered, and informed consent                            was obtained. Prior Anticoagulants: The patient has                            taken no previous anticoagulant or antiplatelet                            agents. ASA Grade Assessment: II - A patient with                            mild systemic disease. After reviewing the risks                            and benefits, the patient was deemed in                            satisfactory condition to undergo the procedure.  After obtaining informed consent, the colonoscope                            was passed under direct vision. Throughout the                            procedure, the patient's blood pressure, pulse, and                            oxygen saturations were monitored continuously. The                            Olympus CF-HQ190L ([id number]) Colonoscope was                             introduced through the anus and advanced to the the                            terminal ileum, with identification of the                            appendiceal orifice and IC valve. The colonoscopy                            was performed without difficulty. The patient                            tolerated the procedure well. The quality of the                            bowel preparation was good. The ileocecal valve,                            appendiceal orifice, and rectum were photographed.                            The bowel preparation used was Plenvu via split                            dose instruction. Scope In: 2:24:39 PM Scope Out: 2:43:27 PM Scope Withdrawal Time: 0 hours 11 minutes 52 seconds  Total Procedure Duration: 0 hours 18 minutes 48 seconds  Findings:                 A diminutive polyp was found in the transverse                            colon. The polyp was sessile. The polyp was removed                            with a cold snare. Resection and retrieval were                            complete. Verification of patient identification  for the specimen was done. Estimated blood loss was                            minimal.                           Multiple diverticula were found in the sigmoid                            colon.                           The exam was otherwise without abnormality on                            direct and retroflexion views.                           The perianal exam findings include a perianal rash.                           The digital rectal exam was normal. Pertinent                            negatives include normal prostate (size, shape, and                            consistency). Complications:            No immediate complications. Estimated Blood Loss:     Estimated blood loss was minimal. Impression:               - One diminutive polyp in the transverse colon,                             removed with a cold snare. Resected and retrieved.                           - Diverticulosis in the sigmoid colon.                           - The examination was otherwise normal on direct                            and retroflexion views.                           - Perianal rash found on perianal exam.                           - Personal history of colonic polyps. 3 adenomas                            02/2017 Recommendation:           - Patient has a contact number available for  emergencies. The signs and symptoms of potential                            delayed complications were discussed with the                            patient. Return to normal activities tomorrow.                            Written discharge instructions were provided to the                            patient.                           - Continue present medications.                           - Repeat colonoscopy is recommended for                            surveillance. The colonoscopy date will be                            determined after pathology results from today's                            exam become available for review.                           - nystatin-triamcinolone ointment for perianal rash Gatha Mayer, MD 02/25/2020 2:56:05 PM This report has been signed electronically.

## 2020-02-25 NOTE — Patient Instructions (Addendum)
I found and removed just one tiny polyp today.  You also have a condition called diverticulosis - common and not usually a problem. Please read the handout provided.  I have prescribed an ointment for skin rash around [facility]us [facility].  I will let you know pathology results and when to have another routine colonoscopy by mail and/or My Chart.  I appreciate the opportunity to care for you. Gatha Mayer, MD, FACG   YOU HAD AN ENDOSCOPIC PROCEDURE TODAY AT [facility]:   Refer to the procedure report that was given to you for any specific questions about what was found during the examination.  If the procedure report does not answer your questions, please call your gastroenterologist to clarify.  If you requested that your care partner not be given the details of your procedure findings, then the procedure report has been included in a sealed envelope for you to review at your convenience later.  YOU SHOULD EXPECT: Some feelings of bloating in the abdomen. Passage of more gas than usual.  Walking can help get rid of the air that was put into your GI tract during the procedure and reduce the bloating. If you had a lower endoscopy (such as a colonoscopy or flexible sigmoidoscopy) you may notice spotting of blood in your stool or on the toilet paper. If you underwent a bowel prep for your procedure, you may not have a normal bowel movement for a few days.  Please Note:  You might notice some irritation and congestion in your nose or some drainage.  This is from the oxygen used during your procedure.  There is no need for concern and it should clear up in a day or so.  SYMPTOMS TO REPORT IMMEDIATELY:   Following lower endoscopy (colonoscopy or flexible sigmoidoscopy):  Excessive amounts of blood in the stool  Significant tenderness or worsening of abdominal pains  Swelling of the abdomen that is new, acute  Fever of 100F or higher   For urgent or emergent issues, a  gastroenterologist can be reached at any hour by calling [phone]. Do not use MyChart messaging for urgent concerns.    DIET:  We do recommend a small meal at first, but then you may proceed to your regular diet.  Drink plenty of fluids but you should avoid alcoholic beverages for 24 hours.  ACTIVITY:  You should plan to take it easy for the rest of today and you should NOT DRIVE or use heavy machinery until tomorrow (because of the sedation medicines used during the test).    FOLLOW UP: Our staff will call the number listed on your records 48-72 hours following your procedure to check on you and address any questions or concerns that you may have regarding the information given to you following your procedure. If we do not reach you, we will leave a message.  We will attempt to reach you two times.  During this call, we will ask if you have developed any symptoms of COVID 19. If you develop any symptoms (ie: fever, flu-like symptoms, shortness of breath, cough etc.) before then, please call [phone].  If you test positive for Covid 19 in the 2 weeks post procedure, please call and report this information to [place].    If any biopsies were taken you will be contacted by phone or by letter within the next 1-3 weeks.  Please call us at [phone] if you have not heard about the biopsies in 3  weeks.    SIGNATURES/CONFIDENTIALITY: You and/or your care partner have signed paperwork which will be entered into your electronic medical record.  These signatures attest to the fact that that the information above on your After Visit Summary has been reviewed and is understood.  Full responsibility of the confidentiality of this discharge information lies with you and/or your care-partner.

## 2020-02-25 NOTE — Progress Notes (Signed)
VS by CW  I have reviewed the patient's medical history in detail and updated the computerized patient record.  

## 2020-02-25 NOTE — Progress Notes (Unsigned)
PT taken to PACU. Monitors in place. VSS. Report given to RN. 

## 2020-02-25 NOTE — Progress Notes (Signed)
Called to room to assist during endoscopic procedure.  Patient ID and intended procedure confirmed with present staff. Received instructions for my participation in the procedure from the performing physician.  

## 2020-02-27 ENCOUNTER — Telehealth: Payer: Self-pay | Admitting: *Deleted

## 2020-02-27 ENCOUNTER — Telehealth: Payer: Self-pay

## 2020-02-27 NOTE — Telephone Encounter (Signed)
No answer on follow up call. No voicemail set up.

## 2020-02-27 NOTE — Telephone Encounter (Signed)
  Follow up Call-  Call back number 02/25/2020  Post procedure Call Back phone  # [phone]  Permission to leave phone message Yes  Some recent data might be hidden     Patient questions:  Do you have a fever, pain , or abdominal swelling? No. Pain Score  0 *  Have you tolerated food without any problems? Yes.    Have you been able to return to your normal activities? Yes.    Do you have any questions about your discharge instructions: Diet   No. Medications  No. Follow up visit  No.  Do you have questions or concerns about your Care? No.  Actions: * If pain score is 4 or above: No action needed, pain <4.  1. Have you developed a fever since your procedure?no  2.   Have you had an respiratory symptoms (SOB or cough) since your procedure? no  3.   Have you tested positive for COVID 19 since your procedure no  4.   Have you had any family members/close contacts diagnosed with the COVID 19 since your procedure? no   If yes to any of these questions please route to Joylene John, RN and Joella Prince, RN

## 2020-03-10 ENCOUNTER — Encounter: Payer: Self-pay | Admitting: Internal Medicine

## 2020-03-18 ENCOUNTER — Other Ambulatory Visit: Payer: Self-pay

## 2020-03-18 DIAGNOSIS — E039 Hypothyroidism, unspecified: Secondary | ICD-10-CM

## 2020-03-18 DIAGNOSIS — I10 Essential (primary) hypertension: Secondary | ICD-10-CM

## 2020-03-18 MED ORDER — DOXAZOSIN MESYLATE 4 MG PO TABS: ORAL_TABLET | ORAL | 3 refills | Status: DC

## 2020-03-18 MED ORDER — LEVOTHYROXINE SODIUM 175 MCG PO TABS: ORAL_TABLET | ORAL | 3 refills | Status: DC

## 2020-03-18 NOTE — Telephone Encounter (Signed)
Request refill on DOXAZOSIN & LEVOTHYROXINE  Sent to [facility]

## 2020-04-24 DIAGNOSIS — D84822 Immunodeficiency due to external causes: Secondary | ICD-10-CM | POA: Diagnosis not present

## 2020-04-24 DIAGNOSIS — I10 Essential (primary) hypertension: Secondary | ICD-10-CM | POA: Diagnosis not present

## 2020-04-24 DIAGNOSIS — E663 Overweight: Secondary | ICD-10-CM | POA: Diagnosis not present

## 2020-04-24 DIAGNOSIS — Z803 Family history of malignant neoplasm of breast: Secondary | ICD-10-CM | POA: Diagnosis not present

## 2020-04-24 DIAGNOSIS — Z7982 Long term (current) use of aspirin: Secondary | ICD-10-CM | POA: Diagnosis not present

## 2020-04-24 DIAGNOSIS — Z8249 Family history of ischemic heart disease and other diseases of the circulatory system: Secondary | ICD-10-CM | POA: Diagnosis not present

## 2020-04-24 DIAGNOSIS — E039 Hypothyroidism, unspecified: Secondary | ICD-10-CM | POA: Diagnosis not present

## 2020-04-24 DIAGNOSIS — Z825 Family history of asthma and other chronic lower respiratory diseases: Secondary | ICD-10-CM | POA: Diagnosis not present

## 2020-04-24 DIAGNOSIS — Z833 Family history of diabetes mellitus: Secondary | ICD-10-CM | POA: Diagnosis not present

## 2020-04-24 DIAGNOSIS — Z823 Family history of stroke: Secondary | ICD-10-CM | POA: Diagnosis not present

## 2020-10-12 NOTE — Telephone Encounter (Signed)
Will you put these in his chart

## 2020-11-05 ENCOUNTER — Other Ambulatory Visit: Payer: Self-pay | Admitting: Adult Health Nurse Practitioner

## 2020-11-05 DIAGNOSIS — E039 Hypothyroidism, unspecified: Secondary | ICD-10-CM

## 2020-11-25 NOTE — Progress Notes (Signed)
COMPLETE PHYSICAL   Assessment and Plan:  Julian Lee was seen today for annual exam.  Diagnoses and all orders for this visit:  Encounter for general adult medical examination with abnormal findings Yearly  Essential hypertension Continue current medications: Benicar HCT 40-25mg  daily in morning Monitor blood pressure at home; call if consistently over 130/80 Continue DASH diet.   Reminder to go to the ER if any CP, SOB, nausea, dizziness, severe HA, changes vision/speech, left arm numbness and tingling and jaw pain. -     EKG 12-Lead CBC  Hyperlipidemia, unspecified hyperlipidemia type No medication at this time Discussed dietary and exercise modifications CMP Lipid  Hypothyroidism, unspecified type Taking levothyroxine 152mcg daily Reminder to take on an empty stomach 30-68mins before first meal of the day. No antacid medications for 4 hours. TSH  Vitamin D deficiency Taking 2,000IU daily, continue supplementation to maintain value of 60-100 Vit D  Medication management Magnesium  Class 2 severe obesity with serious comorbidity and body mass index (BMI) of 39.0 to 39.9 in adult, unspecified obesity type ([place]) Discussed dietary and exercise modifications Walks 4/7 days a week Increase vegetables and decrease carbohydrates.  Abnormal glucose Discussed dietary and exercise modifications  BPH associated with nocturia Taking Cardura 4mg  1/2 tab daily Wakes 3 times a night Continue to monitor PSA  Sinus Bradycardia Pt is walking three times a day for over 6 months- well conditioned cardiac Denies blurred vision, dizziness, fatigue Stressed importance of monitoring signs for bradycardia worsening - call office or go to ER if he develops light headedness, dizziness, weakness/fatigue during activity, confusion If he develops symptoms with bradycardia will refer to cardiology for evaluation Go to the ER if any chest pain, shortness of breath, nausea, dizziness, severe  HA, changes vision/speech He agrees with plan of care  Hx of adenomatous colonic polyps Colonoscopy 02/2020 due 2027  Screening for hematuria/proteinuria Routine urine with reflex microscopic Microalbumin/creatinine urine ratio   BMI 23 Advised to eat regular meals and focus on maintaining weight Encouraged healthy behaviors Discussed dietary and exercise modifications  Psorisis Doing well at this time    Further disposition pending results if labs check today. Discussed med's effects and SE's.   Over 40 minutes of face to face interview, exam, counseling, chart review, and critical decision making was performed.   HPI Julian Lee 65 year old patient presents for complete physical.   He has HTN, HLD, Hypothyroidism, BPH, abnormal glucose, weight and Vitamin D deficiency.   He has had elevated blood pressure for over 15 + years. Reports he checks his blood pressure at home intermittently and it is in the normal range. He is on 1/2 Olmesartan 40/HCTZ25mg , 1/2 cardura 4 mg today their BP is BP: 118/68 .   BP Readings from Last 3 Encounters:  11/26/20 118/68  02/25/20 114/67  11/27/19 118/80   He has been keeping track of his blood pressures and weight.  His BP running 118-120/70's. Denies any dizzines, headache, shortness of breath or chest pain. Pulse Readings from Last 3 Encounters:  11/26/20 (!) 52  02/25/20 60  11/27/19 66     Lab Results  Component Value Date   GFRNONAA 79 11/28/2019   He does workout and walks 3.30miles every day.  He splits this and walks three times a day. He denies chest pain, shortness of breath, dizziness.   He has history of eczema on his hands, has improved greatly,uses triamcinolone.   He is not on cholesterol medication and denies myalgias. His cholesterol is  at goal. The cholesterol last visit was:   Lab Results  Component Value Date   CHOL 169 11/28/2019   HDL 42 11/28/2019   LDLCALC 109 (H) 11/28/2019   TRIG 96 11/28/2019   CHOLHDL 2.9  04/07/2014    Last A1C in the office was:  Lab Results  Component Value Date   HGBA1C 5.2 11/28/2019   Patient is on Vitamin D supplement.   Lab Results  Component Value Date   VD25OH 96.1 11/28/2019     He is on thyroid medication. 1 tab everyday except M-W-F he takes 1 1/2 tab His medication was not changed last visit. Patient denies nervousness, palpitations and weight changes.  Lab Results  Component Value Date   TSH 1.260 11/28/2019   BMI is Body mass index is 23.06 kg/m., he is working on diet and exercise. His highest weight was 313 now 174. He has replaced rice with cauliflower and pasta for zucchini.  He reports he is drinking four 32ounce cups of water every day. Walking daily after each meal and going to the gym 3 times a week.  Wt Readings from Last 3 Encounters:  11/26/20 174 lb 12.8 oz (79.3 kg)  02/25/20 233 lb (105.7 kg)  02/11/20 233 lb (105.7 kg)   Lab Results  Component Value Date   PSA 0.70 10/01/2013     Current Medications:  Current Outpatient Medications on File Prior to Visit  Medication Sig Dispense Refill   BABY ASPIRIN PO Take 81 mg by mouth daily.     Cholecalciferol (VITAMIN D PO) Take 2,000 Units by mouth 3 (three) times daily.      doxazosin (CARDURA) 4 MG tablet Take 1 tablet at Bedtime for BP & Prostate 90 tablet 3   Flaxseed, Linseed, (FLAX SEED OIL PO) Take by mouth daily. Takes 2 caps daily     levothyroxine (SYNTHROID) 175 MCG tablet TAKE 1 TABLET DAILY ON AN EMPTY STOMACH WITH ONLY WATER FOR 30 MINUTES AND NO ANTACID MEDICATIONS, CALCIUM, OR MAGNESIUM FOR 4 HOURS AND AVOID BIOTIN 90 tablet 3   olmesartan-hydrochlorothiazide (BENICAR HCT) 40-25 MG tablet Take 1 tablet Daily for BP 90 tablet 3   zinc gluconate 50 MG tablet Take 50 mg by mouth daily.     triamcinolone cream (KENALOG) 0.5 % Apply 1 application topically 2 (two) times daily. (Patient not taking: Reported on 02/11/2020) 80 g 2   No current facility-administered medications on  file prior to visit.   Health Maintenance:  Immunization History  Administered Date(s) Administered   Influenza Inj Mdck Quad With Preservative 11/08/2016, 11/07/2017   Influenza Split 09/26/2012   Influenza, High Dose Seasonal PF 11/12/2018, 09/23/2020   Influenza-Unspecified 11/07/2016, 11/07/2017   Moderna Covid-19 Vaccine Bivalent Booster 50yrs & up 10/12/2020   PFIZER(Purple Top)SARS-COV-2 Vaccination 04/04/2019, 04/29/2019   PPD Test 04/01/2013   Pneumococcal Conjugate-13 11/12/2018   Pneumococcal Polysaccharide-23 09/16/2008   Td 08/22/2006   Tdap 11/08/2016   Zoster Recombinat (Shingrix) 08/04/2020, 10/12/2020   Zoster, Live 01/10/2006   Tetanus: 2018 Pneumovax: 2010 Prevnar 13: 11/2018 Flu vaccine: 09/23/20 Zostavax: 2008  CXR 2016 Colonoscopy:02/2020 1 sessile polyp, Dr. Carlean Purl 2027 Cardiolite negative 2009 DEE: 6 years ago- has not had.   Eye Exam: Due Dental Exam: 09/2018  Allergies:  Allergies  Allergen Reactions   Latex      Medical History:  Past Medical History:  Diagnosis Date   Allergy    Graves disease    pt. had radiation and is now on  Thyroid medication    Hx of adenomatous colonic polyps 02/16/2017   Hyperlipidemia    not on meds  only LDL in the past   Hypertension    Thyroid disease    Vitamin D deficiency    SURGICAL HISTORY He  has a past surgical history that includes Tonsillectomy and adenoidectomy and Colonoscopy (2019). FAMILY HISTORY His family history includes Cancer in his mother; Heart attack in his father; Rheum arthritis in his mother. SOCIAL HISTORY He  reports that he quit smoking about 16 years ago. His smoking use included cigarettes. He quit smokeless tobacco use about 30 years ago. He reports current alcohol use. He reports that he does not use drugs.   Review of Systems  Constitutional: Negative.  Negative for chills and fever.  HENT: Negative.  Negative for congestion, hearing loss, sinus pain, sore throat and  tinnitus.   Eyes: Negative.  Negative for blurred vision and double vision.  Respiratory: Negative.  Negative for cough, hemoptysis, sputum production, shortness of breath and wheezing.   Cardiovascular: Negative.  Negative for chest pain, palpitations and leg swelling.  Gastrointestinal: Negative.  Negative for abdominal pain, constipation, diarrhea, heartburn, nausea and vomiting.  Genitourinary: Negative.  Negative for dysuria and urgency.       Nocturia x 2, mild BPH symptoms, declines medications, no change  Musculoskeletal: Negative.  Negative for back pain, falls, joint pain, myalgias and neck pain.  Skin: Negative.  Negative for rash.  Neurological: Negative.  Negative for dizziness, tingling, tremors, weakness and headaches.  Endo/Heme/Allergies: Negative.  Does not bruise/bleed easily.  Psychiatric/Behavioral: Negative.  Negative for depression and suicidal ideas. The patient is not nervous/anxious and does not have insomnia.     Physical Exam: Estimated body mass index is 23.06 kg/m as calculated from the following:   Height as of this encounter: 6\' 1"  (1.854 m).   Weight as of this encounter: 174 lb 12.8 oz (79.3 kg). BP 118/68   Pulse (!) 52   Temp 97.9 F (36.6 C)   Resp 16   Ht 6\' 1"  (1.854 m)   Wt 174 lb 12.8 oz (79.3 kg)   SpO2 97%   BMI 23.06 kg/m    General Appearance: Well nourished, in no apparent distress. Eyes: PERRLA, EOMs, conjunctiva no swelling or erythema, normal fundi and vessels. Sinuses: No Frontal/maxillary tenderness ENT/Mouth: Ext aud canals clear, normal light reflex with TMs without erythema, bulging. Good dentition. No erythema, swelling, or exudate on post pharynx. Tonsils not swollen or erythematous. Hearing normal.  Neck: Supple, thyroid normal. No bruits Respiratory: Respiratory effort normal, BS equal bilaterally without rales, rhonchi, wheezing or stridor. Cardio: RRR without murmurs, rubs or gallops. Brisk peripheral pulses without  edema.  Chest: symmetric, with normal excursions and percussion. Abdomen: Soft, +BS, obese. Non tender, no guarding, rebound, hernias, masses, or organomegaly.  Lymphatics: Non tender without lymphadenopathy.  Genitourinary: defer Musculoskeletal: Full ROM all peripheral extremities,5/5 strength, and normal gait. Skin: Warm, dry without rashes, lesions, ecchymosis.  Neuro: Cranial nerves intact, reflexes equal bilaterally. Normal muscle tone, no cerebellar symptoms. Sensation intact.  Psych: Awake and oriented X 3, normal affect, Insight and Judgment appropriate.   EKG: Sinus bradycardia, No ST changes    [facility]  11/26/2020

## 2020-11-26 ENCOUNTER — Encounter: Payer: Self-pay | Admitting: Nurse Practitioner

## 2020-11-26 ENCOUNTER — Ambulatory Visit (INDEPENDENT_AMBULATORY_CARE_PROVIDER_SITE_OTHER): Payer: Medicare HMO | Admitting: Nurse Practitioner

## 2020-11-26 ENCOUNTER — Other Ambulatory Visit: Payer: Self-pay

## 2020-11-26 VITALS — BP 118/68 | HR 52 | Temp 97.9°F | Resp 16 | Ht 73.0 in | Wt 174.8 lb

## 2020-11-26 DIAGNOSIS — R7309 Other abnormal glucose: Secondary | ICD-10-CM | POA: Diagnosis not present

## 2020-11-26 DIAGNOSIS — Z79899 Other long term (current) drug therapy: Secondary | ICD-10-CM | POA: Diagnosis not present

## 2020-11-26 DIAGNOSIS — E039 Hypothyroidism, unspecified: Secondary | ICD-10-CM

## 2020-11-26 DIAGNOSIS — N401 Enlarged prostate with lower urinary tract symptoms: Secondary | ICD-10-CM | POA: Diagnosis not present

## 2020-11-26 DIAGNOSIS — R351 Nocturia: Secondary | ICD-10-CM | POA: Diagnosis not present

## 2020-11-26 DIAGNOSIS — Z136 Encounter for screening for cardiovascular disorders: Secondary | ICD-10-CM

## 2020-11-26 DIAGNOSIS — Z6823 Body mass index (BMI) 23.0-23.9, adult: Secondary | ICD-10-CM

## 2020-11-26 DIAGNOSIS — Z86010 Personal history of colonic polyps: Secondary | ICD-10-CM

## 2020-11-26 DIAGNOSIS — R001 Bradycardia, unspecified: Secondary | ICD-10-CM

## 2020-11-26 DIAGNOSIS — Z1389 Encounter for screening for other disorder: Secondary | ICD-10-CM

## 2020-11-26 DIAGNOSIS — E785 Hyperlipidemia, unspecified: Secondary | ICD-10-CM

## 2020-11-26 DIAGNOSIS — E559 Vitamin D deficiency, unspecified: Secondary | ICD-10-CM

## 2020-11-26 DIAGNOSIS — Z0000 Encounter for general adult medical examination without abnormal findings: Secondary | ICD-10-CM

## 2020-11-26 DIAGNOSIS — L409 Psoriasis, unspecified: Secondary | ICD-10-CM

## 2020-11-26 DIAGNOSIS — I10 Essential (primary) hypertension: Secondary | ICD-10-CM | POA: Diagnosis not present

## 2020-11-26 DIAGNOSIS — Z0001 Encounter for general adult medical examination with abnormal findings: Secondary | ICD-10-CM

## 2020-11-26 DIAGNOSIS — Z860101 Personal history of adenomatous and serrated colon polyps: Secondary | ICD-10-CM

## 2020-11-26 NOTE — Patient Instructions (Addendum)
Bradycardia, Adult °Bradycardia is a slower-than-normal heartbeat. A normal resting heart rate for an adult ranges from 60 to 100 beats per minute. With bradycardia, the resting heart rate is less than 60 beats per minute. °Bradycardia can prevent enough oxygen from reaching certain areas of your body when you are active. It can be serious if it keeps enough oxygen from reaching your brain and other parts of your body. Bradycardia is not a problem for everyone. For some healthy adults, a slow resting heart rate is normal. °What are the causes? °This condition may be caused by: °A problem with the heart, including: °A problem with the heart's electrical system, such as a heart block. With a heart block, electrical signals between the chambers of the heart are partially or completely blocked, so they are not able to work as they should. °A problem with the heart's natural pacemaker (sinus node). °Heart disease. °A heart attack. °Heart damage. °Lyme disease. °A heart infection. °A heart condition that is present at birth (congenital heart defect). °Certain medicines that treat heart conditions. °Certain conditions, such as hypothyroidism and obstructive sleep apnea. °Problems with the balance of chemicals and other substances, like potassium, in the blood. °Trauma. °Radiation therapy. °What increases the risk? °You are more likely to develop this condition if you: °Are age 65 or older. °Have high blood pressure (hypertension), high cholesterol (hyperlipidemia), or diabetes. °Drink heavily, use tobacco or nicotine products, or use drugs. °What are the signs or symptoms? °Symptoms of this condition include: °Light-headedness. °Feeling faint or fainting. °Fatigue and weakness. °Trouble with activity or exercise. °Shortness of breath. °Chest pain (angina). °Drowsiness. °Confusion. °Dizziness. °How is this diagnosed? °This condition may be diagnosed based on: °Your symptoms. °Your medical history. °A physical exam. °During  the exam, your health care provider will listen to your heartbeat and check your pulse. To confirm the diagnosis, your health care provider may order tests, such as: °Blood tests. °An electrocardiogram (ECG). This test records the heart's electrical activity. The test can show how fast your heart is beating and whether the heartbeat is steady. °A test in which you wear a portable device (event recorder or Holter monitor) to record your heart's electrical activity while you go about your day. °An exercise test. °How is this treated? °Treatment for this condition depends on the cause of the condition and how severe your symptoms are. Treatment may involve: °Treatment of the underlying condition. °Changing your medicines or how much medicine you take. °Having a small, battery-operated device called a pacemaker implanted under the skin. When bradycardia occurs, this device can be used to increase your heart rate and help your heart beat in a regular rhythm. °Follow these instructions at home: °Lifestyle °Manage any health conditions that contribute to bradycardia as told by your health care provider. °Follow a heart-healthy diet. A nutrition specialist (dietitian) can help educate you about healthy food options and changes. °Follow an exercise program that is approved by your health care provider. °Maintain a healthy weight. °Try to reduce or manage your stress, such as with yoga or meditation. If you need help reducing stress, ask your health care provider. °Do not use any products that contain nicotine or tobacco. These products include cigarettes, chewing tobacco, and vaping devices, such as e-cigarettes. If you need help quitting, ask your health care provider. °Do not use illegal drugs. °Alcohol use °If you drink alcohol: °Limit how much you have to: °0-1 drink a day for women who are not pregnant. °0-2 drinks a day   for men. °Know how much alcohol is in a drink. In the [place] one drink equals one 12 oz bottle of  beer (355 mL), one 5 oz glass of wine (148 mL), or one 1½ oz glass of hard liquor (44 mL). °General instructions °Take over-the-counter and prescription medicines only as told by your health care provider. °Keep all follow-up visits. This is important. °How is this prevented? °In some cases, bradycardia may be prevented by: °Treating underlying medical problems. °Stopping behaviors or medicines that can trigger the condition. °Contact a health care provider if: °You feel light-headed or dizzy. °You almost faint. °You feel weak or are easily fatigued during physical activity. °You experience confusion or have memory problems. °Get help right away if: °You faint. °You have chest pains or an irregular heartbeat (palpitations). °You have trouble breathing. °These symptoms may represent a serious problem that is an emergency. Do not wait to see if the symptoms will go away. Get medical help right away. Call your local emergency services (911 in the [place] Do not drive yourself to the hospital. °Summary °Bradycardia is a slower-than-normal heartbeat. With bradycardia, the resting heart rate is less than 60 beats per minute. °Treatment for this condition depends on the cause. °Manage any health conditions that contribute to bradycardia as told by your health care provider. °Do not use any products that contain nicotine or tobacco. These products include cigarettes, chewing tobacco, and vaping devices, such as e-cigarettes. °Keep all follow-up visits. This is important. °This information is not intended to replace advice given to you by your health care provider. Make sure you discuss any questions you have with your health care provider. °Document Revised: 04/19/2020 Document Reviewed: 04/19/2020 °Elsevier Patient Education © 2022 [employer] ° °

## 2020-11-27 LAB — CBC WITH DIFFERENTIAL/PLATELET
Absolute Monocytes: 418 cells/uL (ref 200–950)
Basophils Absolute: 79 cells/uL (ref 0–200)
Basophils Relative: 1.8 %
Eosinophils Absolute: 229 cells/uL (ref 15–500)
Eosinophils Relative: 5.2 %
HCT: 45.3 % (ref 38.5–50.0)
Hemoglobin: 14.8 g/dL (ref 13.2–17.1)
Lymphs Abs: 1008 cells/uL (ref 850–3900)
MCH: 30.6 pg (ref 27.0–33.0)
MCHC: 32.7 g/dL (ref 32.0–36.0)
MCV: 93.6 fL (ref 80.0–100.0)
MPV: 11.6 fL (ref 7.5–12.5)
Monocytes Relative: 9.5 %
Neutro Abs: 2666 cells/uL (ref 1500–7800)
Neutrophils Relative %: 60.6 %
Platelets: 169 10*3/uL (ref 140–400)
RBC: 4.84 10*6/uL (ref 4.20–5.80)
RDW: 11.9 % (ref 11.0–15.0)
Total Lymphocyte: 22.9 %
WBC: 4.4 10*3/uL (ref 3.8–10.8)

## 2020-11-27 LAB — MICROALBUMIN / CREATININE URINE RATIO
Creatinine, Urine: 47 mg/dL (ref 20–320)
Microalb Creat Ratio: 6 mcg/mg creat (ref <30)
Microalb, Ur: 0.3 mg/dL

## 2020-11-27 LAB — URINALYSIS, ROUTINE W REFLEX MICROSCOPIC
Bilirubin Urine: NEGATIVE
Glucose, UA: NEGATIVE
Hgb urine dipstick: NEGATIVE
Leukocytes,Ua: NEGATIVE
Nitrite: NEGATIVE
Protein, ur: NEGATIVE
Specific Gravity, Urine: 1.008 (ref 1.001–1.035)
pH: 6.5 (ref 5.0–8.0)

## 2020-11-27 LAB — COMPREHENSIVE METABOLIC PANEL
AG Ratio: 1.8 (calc) (ref 1.0–2.5)
ALT: 15 U/L (ref 9–46)
AST: 24 U/L (ref 10–35)
Albumin: 4.4 g/dL (ref 3.6–5.1)
Alkaline phosphatase (APISO): 73 U/L (ref 35–144)
BUN: 13 mg/dL (ref 7–25)
CO2: 30 mmol/L (ref 20–32)
Calcium: 10.1 mg/dL (ref 8.6–10.3)
Chloride: 103 mmol/L (ref 98–110)
Creat: 0.96 mg/dL (ref 0.70–1.35)
Globulin: 2.5 g/dL (calc) (ref 1.9–3.7)
Glucose, Bld: 77 mg/dL (ref 65–99)
Potassium: 4.4 mmol/L (ref 3.5–5.3)
Sodium: 140 mmol/L (ref 135–146)
Total Bilirubin: 0.8 mg/dL (ref 0.2–1.2)
Total Protein: 6.9 g/dL (ref 6.1–8.1)

## 2020-11-27 LAB — HEMOGLOBIN A1C
Hgb A1c MFr Bld: 4.7 % of total Hgb (ref <5.7)
Mean Plasma Glucose: 88 mg/dL
eAG (mmol/L): 4.9 mmol/L

## 2020-11-27 LAB — LIPID PANEL
Cholesterol: 147 mg/dL (ref <200)
HDL: 56 mg/dL (ref >=40)
LDL Cholesterol (Calc): 69 mg/dL (calc)
Non-HDL Cholesterol (Calc): 91 mg/dL (calc) (ref <130)
Total CHOL/HDL Ratio: 2.6 (calc) (ref <5.0)
Triglycerides: 134 mg/dL (ref <150)

## 2020-11-27 LAB — MAGNESIUM: Magnesium: 2.1 mg/dL (ref 1.5–2.5)

## 2020-11-27 LAB — VITAMIN D 25 HYDROXY (VIT D DEFICIENCY, FRACTURES): Vit D, 25-Hydroxy: 136 ng/mL — ABNORMAL HIGH (ref 30–100)

## 2020-11-27 LAB — PSA: PSA: 0.6 ng/mL (ref <=4.00)

## 2020-11-27 LAB — TSH: TSH: 0.68 mIU/L (ref 0.40–4.50)

## 2021-03-01 ENCOUNTER — Encounter (HOSPITAL_COMMUNITY): Payer: Self-pay

## 2021-03-01 ENCOUNTER — Emergency Department (HOSPITAL_COMMUNITY)
Admission: EM | Admit: 2021-03-01 | Discharge: 2021-03-01 | Disposition: A | Discharge status: Home / Self Care | Payer: Medicare HMO | Attending: Emergency Medicine | Admitting: Emergency Medicine

## 2021-03-01 ENCOUNTER — Emergency Department (HOSPITAL_COMMUNITY): Payer: Medicare HMO

## 2021-03-01 ENCOUNTER — Other Ambulatory Visit: Payer: Self-pay

## 2021-03-01 DIAGNOSIS — W06XXXA Fall from bed, initial encounter: Secondary | ICD-10-CM | POA: Diagnosis not present

## 2021-03-01 DIAGNOSIS — R55 Syncope and collapse: Secondary | ICD-10-CM | POA: Diagnosis not present

## 2021-03-01 DIAGNOSIS — Z7982 Long term (current) use of aspirin: Secondary | ICD-10-CM | POA: Diagnosis not present

## 2021-03-01 DIAGNOSIS — I10 Essential (primary) hypertension: Secondary | ICD-10-CM | POA: Insufficient documentation

## 2021-03-01 DIAGNOSIS — Z91040 Latex allergy status: Secondary | ICD-10-CM | POA: Diagnosis not present

## 2021-03-01 DIAGNOSIS — Z743 Need for continuous supervision: Secondary | ICD-10-CM | POA: Diagnosis not present

## 2021-03-01 DIAGNOSIS — R0902 Hypoxemia: Secondary | ICD-10-CM | POA: Diagnosis not present

## 2021-03-01 DIAGNOSIS — R42 Dizziness and giddiness: Secondary | ICD-10-CM | POA: Diagnosis not present

## 2021-03-01 DIAGNOSIS — S0990XA Unspecified injury of head, initial encounter: Secondary | ICD-10-CM | POA: Diagnosis not present

## 2021-03-01 DIAGNOSIS — S00531A Contusion of lip, initial encounter: Secondary | ICD-10-CM | POA: Diagnosis not present

## 2021-03-01 DIAGNOSIS — S0083XA Contusion of other part of head, initial encounter: Secondary | ICD-10-CM | POA: Diagnosis not present

## 2021-03-01 DIAGNOSIS — R001 Bradycardia, unspecified: Secondary | ICD-10-CM | POA: Insufficient documentation

## 2021-03-01 LAB — CBC WITH DIFFERENTIAL/PLATELET
Abs Immature Granulocytes: 0.01 10*3/uL (ref 0.00–0.07)
Basophils Absolute: 0 10*3/uL (ref 0.0–0.1)
Basophils Relative: 1 %
Eosinophils Absolute: 0.1 10*3/uL (ref 0.0–0.5)
Eosinophils Relative: 2 %
HCT: 40.1 % (ref 39.0–52.0)
Hemoglobin: 13.3 g/dL (ref 13.0–17.0)
Immature Granulocytes: 0 %
Lymphocytes Relative: 20 %
Lymphs Abs: 0.8 10*3/uL (ref 0.7–4.0)
MCH: 30.6 pg (ref 26.0–34.0)
MCHC: 33.2 g/dL (ref 30.0–36.0)
MCV: 92.4 fL (ref 80.0–100.0)
Monocytes Absolute: 0.3 10*3/uL (ref 0.1–1.0)
Monocytes Relative: 7 %
Neutro Abs: 2.7 10*3/uL (ref 1.7–7.7)
Neutrophils Relative %: 70 %
Platelets: 86 10*3/uL — ABNORMAL LOW (ref 150–400)
RBC: 4.34 MIL/uL (ref 4.22–5.81)
RDW: 12.8 % (ref 11.5–15.5)
WBC: 3.8 10*3/uL — ABNORMAL LOW (ref 4.0–10.5)
nRBC: 0 % (ref 0.0–0.2)

## 2021-03-01 LAB — BASIC METABOLIC PANEL
Anion gap: 9 (ref 5–15)
BUN: 18 mg/dL (ref 8–23)
CO2: 25 mmol/L (ref 22–32)
Calcium: 9.2 mg/dL (ref 8.9–10.3)
Chloride: 104 mmol/L (ref 98–111)
Creatinine, Ser: 0.93 mg/dL (ref 0.61–1.24)
GFR, Estimated: >60 mL/min (ref >60)
Glucose, Bld: 97 mg/dL (ref 70–99)
Potassium: 3.7 mmol/L (ref 3.5–5.1)
Sodium: 138 mmol/L (ref 135–145)

## 2021-03-01 NOTE — Discharge Instructions (Addendum)
There is no sign of internal bleeding today but you do have significant bruising on your forehead.  You can use ice to help with the swelling.  You may have a black eye tomorrow.  If you start having severe headaches, persistent vomiting, difficulty walking due to dizziness or other concerns return to the emergency room.  Otherwise you can resume your normal activity.

## 2021-03-01 NOTE — ED Triage Notes (Signed)
Patient BIB [facility] from home after syncopal episode. Patient  hit his head while moving his bed frame and afterwards had a syncopal episode. Patient A&Ox4 at this time. Noticeable swelling to forehead noted at this time. Patient reports lip pain.

## 2021-03-01 NOTE — ED Provider Notes (Signed)
[facility] EMERGENCY DEPARTMENT Provider Note   CSN: [id number] Arrival date & time: 03/01/21  1214     History  Chief Complaint  Patient presents with   Head Injury    Julian Lee is a 66 y.o. male.  Patient is a 66 year old male with a history of hypertension, hyperlipidemia and Graves' disease who is presenting today by EMS after head injury and syncopal event.  Patient was moving a bed frame today when he lost his balance and fell forward hitting his head on an oak bed frame.  He went and looked in the bathroom at the area and noticed a lot of swelling to his left forehead and then started not feeling well.  He went downstairs where there were other men doing Architect and they said he seemed kind of out of it and was not acting himself and they lowered him to the floor where he then passed out for approximately 1 minute.  Patient does not remember that but reports now he is having no chest pain, shortness of breath or palpitations.  He does not recall any prior to that.  He does not recall history of any syncope in the past.  He denies any headache, neck pain.  He takes no anticoagulation.  EMS reported that patient was awake alert and had no acute abnormalities on vital signs in route.  Blood sugar was 122.  He was bradycardic which she reported is baseline for him.  He reports right now he feels normal except feeling slightly anxious because he is here.   Head Injury     Home Medications Prior to Admission medications   Medication Sig Start Date End Date Taking? Authorizing Provider  BABY ASPIRIN PO Take 81 mg by mouth daily.    [provider]  Cholecalciferol (VITAMIN D PO) Take 2,000 Units by mouth 3 (three) times daily.     [provider]  doxazosin (CARDURA) 4 MG tablet Take 1 tablet at Bedtime for BP & Prostate 03/18/20   Garnet Sierras, NP  Flaxseed, Linseed, (FLAX SEED OIL PO) Take by mouth daily. Takes 2 caps daily     [provider]  levothyroxine (SYNTHROID) 175 MCG tablet TAKE 1 TABLET DAILY ON AN EMPTY STOMACH WITH ONLY WATER FOR 30 MINUTES AND NO ANTACID MEDICATIONS, CALCIUM, OR MAGNESIUM FOR 4 HOURS AND AVOID BIOTIN 11/05/20   Liane Comber, NP  olmesartan-hydrochlorothiazide (BENICAR HCT) 40-25 MG tablet Take 1 tablet Daily for BP 01/22/20   McClanahan, Kyra, NP  triamcinolone cream (KENALOG) 0.5 % Apply 1 application topically 2 (two) times daily. Patient not taking: Reported on 02/11/2020 05/14/18   Vladimir Crofts, PA-C  zinc gluconate 50 MG tablet Take 50 mg by mouth daily.    [provider]      Allergies    Latex    Review of Systems   Review of Systems  Physical Exam Updated Vital Signs BP 129/76    Pulse (!) 48    Temp (!) 97.5 F (36.4 C) (Oral)    Resp 15    Ht 6\' 1"  (1.854 m)    Wt 77.1 kg    SpO2 100%    BMI 22.43 kg/m  Physical Exam Vitals and nursing note reviewed.  Constitutional:      General: He is not in acute distress.    Appearance: He is well-developed.  HENT:     Head: Normocephalic.      Comments: Large hematoma and minimal superficial  abrasions over the left forehead Eyes:     Conjunctiva/sclera: Conjunctivae normal.     Pupils: Pupils are equal, round, and reactive to light.  Cardiovascular:     Rate and Rhythm: Regular rhythm. Bradycardia present.     Heart sounds: No murmur heard. Pulmonary:     Effort: Pulmonary effort is normal. No respiratory distress.     Breath sounds: Normal breath sounds. No wheezing or rales.  Musculoskeletal:        General: No tenderness. Normal range of motion.     Cervical back: Normal range of motion and neck supple. No tenderness.     Right lower leg: No edema.     Left lower leg: No edema.  Skin:    General: Skin is warm and dry.     Findings: No erythema or rash.  Neurological:     General: No focal deficit present.     Mental Status: He is alert and oriented to person, place, and time. Mental status  is at baseline.     Cranial Nerves: No cranial nerve deficit.     Sensory: No sensory deficit.     Motor: No weakness.     Coordination: Coordination normal.     Gait: Gait normal.  Psychiatric:        Mood and Affect: Mood normal.        Behavior: Behavior normal.    ED Results / Procedures / Treatments   Labs (all labs ordered are listed, but only abnormal results are displayed) Labs Reviewed  CBC WITH DIFFERENTIAL/PLATELET - Abnormal; Notable for the following components:      Result Value   WBC 3.8 (*)    Platelets 86 (*)    All other components within normal limits  BASIC METABOLIC PANEL    EKG EKG Interpretation  Date/Time:  Monday March 01 2021 13:56:14 EST Ventricular Rate:  48 PR Interval:  158 QRS Duration: 104 QT Interval:  450 QTC Calculation: 402 R Axis:   1 Text Interpretation: Sinus bradycardia Probable anteroseptal infarct, old No previous tracing Confirmed by Blanchie Dessert [phone]) on 03/01/2021 2:16:46 PM  Radiology CT Head Wo Contrast  Result Date: 03/01/2021 CLINICAL DATA:  Syncopal episode after head trauma. EXAM: CT HEAD WITHOUT CONTRAST TECHNIQUE: Contiguous axial images were obtained from the base of the skull through the vertex without intravenous contrast. RADIATION DOSE REDUCTION: This exam was performed according to the departmental dose-optimization program which includes automated exposure control, adjustment of the mA and/or kV according to patient size and/or use of iterative reconstruction technique. COMPARISON:  None. FINDINGS: Brain: No hydrocephalus. No mass, hemorrhage, edema or other evidence of acute parenchymal abnormality. No extra-axial hemorrhage. Vascular: No hyperdense vessel or unexpected calcification. Skull: Normal. Negative for fracture or focal lesion. Sinuses/Orbits: Mild mucosal thickening within the ethmoid air cells and maxillary sinuses. Sinuses otherwise clear. Periorbital and retro-orbital soft tissues are  unremarkable. Other: Focal scalp edema/hematoma overlying the LEFT frontal bone. No underlying fracture. IMPRESSION: 1. Focal scalp edema/hematoma overlying the LEFT frontal bone. No underlying fracture. 2. No acute intracranial abnormality. No intracranial mass, hemorrhage or edema. Electronically Signed   By: Franki Cabot M.D.   On: 03/01/2021 13:56    Procedures Procedures    Medications Ordered in ED Medications - No data to display  ED Course/ Medical Decision Making/ A&P                           Medical  Decision Making Amount and/or Complexity of Data Reviewed Independent Historian: EMS External Data Reviewed: labs and notes. Labs: ordered. Decision-making details documented in ED Course. Radiology: ordered and independent interpretation performed. Decision-making details documented in ED Course.   Patient is a 66 year old male presenting today after having a trauma to the forehead when he fell while moving furniture and then having a subsequent syncopal event.  Patient did not have any preceding symptoms or syncope causing the fall.  Suspect a vasovagal event after the injury when he looked at it in the mirror.  Patient in for an acute dysrhythmia.  Patient is otherwise at baseline currently and has no complaints.  When he had the syncopal event he was lowered to the floor and had no trauma in addition.  His vital signs are reassuring here.  He does have bradycardia which patient states is chronic.  He does not take any anticoagulation but has significant trauma to the left forehead from his initial fall.  Imaging is pending.  EKG and labs to ensure no other acute process.  2:51 PM I independently reviewed and interpreted patient's head CT which shows no sign of bleeding today.  Radiology reported forehead hematoma but no other acute pathology.  I independently interpreted patient's labs and his CBC today has a mild leukopenia of 3.8 and thrombocytopenia of 87 from a normal baseline a  few months ago when he had labs drawn.  BMP within normal limits.  Patient was able to get out of bed walk to the bathroom without any difficulty.  Feel that patient is stable for discharge home and does not meet any criteria for admission today however did discuss with him the labs and the need for follow-up CBC in 1 to 2 weeks with his PCP.  Patient voices understanding.  He was given return precautions.  Stable for discharge at this time.  No social determinants affecting his discharge today.        Final Clinical Impression(s) / ED Diagnoses Final diagnoses:  Contusion of face, initial encounter  Vasovagal syncope    Rx / DC Orders ED Discharge Orders     None         Blanchie Dessert, MD 03/01/21 1451

## 2021-03-08 NOTE — Progress Notes (Signed)
ER follow up  Assessment and Plan: ER follow up for :   Ardell was seen today for hospitalization follow-up.  Diagnoses and all orders for this visit:  Leukopenia, unspecified type Continue to monitor -     CBC with Differential/Platelet  Thrombocytopenia ([place]) Continue to monitor -     CBC with Differential/Platelet -     COMPLETE METABOLIC PANEL WITH GFR  Traumatic injury of head, subsequent encounter Healing well. Denies headaches, visual changes and dizziness Continue to monitor -     COMPLETE METABOLIC PANEL WITH GFR  Hypothyroidism, unspecified type Please take your thyroid medication greater than 30 min before breakfast, separated by at least 4 hours  from antacids, calcium, iron, and multivitamins.  -     TSH  Sinus bradycardia Continue to monitor If has more syncopal episodes would like to refer to cardiology for evlauation Go to the ER if any chest pain, shortness of breath, nausea, dizziness, severe HA, changes vision/speech      All medications were reviewed with patient and family and fully reconciled. All questions answered fully, and patient and family members were encouraged to call the office with any further questions or concerns. Discussed goal to avoid readmission related to this diagnosis.  There are no discontinued medications.  CAN NOT DO FOR BCBS REGULAR OR MEDICARE Over 40 minutes of exam, counseling, chart review, and complex, high/moderate level critical decision making was performed this visit.   Future Appointments  Date Time Provider [place]  05/26/2021  9:30 AM Unk Pinto, MD [facility] None     HPI 66 y.o.male presents for follow up from recent ER visit Patient is a 66 year old male presenting today after having a trauma to the forehead when he fell while moving furniture and then having a subsequent syncopal event.  Patient did not have any preceding symptoms or syncope causing the fall.  Suspect a vasovagal event after  the injury when he looked at it in the mirror.  Patient in for an acute dysrhythmia.  Patient is otherwise at baseline currently and has no complaints.  When he had the syncopal event he was lowered to the floor and had no trauma in addition.  His vital signs are reassuring here.  He does have bradycardia which patient states is chronic.  He does not take any anticoagulation but has significant trauma to the left forehead from his initial fall.  Imaging is pending.  EKG and labs to ensure no other acute process.   2:51 PM I independently reviewed and interpreted patient's head CT which shows no sign of bleeding today.  Radiology reported forehead hematoma but no other acute pathology.  I independently interpreted patient's labs and his CBC today has a mild leukopenia of 3.8 and thrombocytopenia of 87 from a normal baseline a few months ago when he had labs drawn.  BMP within normal limits.  Patient was able to get out of bed walk to the bathroom without any difficulty.  Feel that patient is stable for discharge home and does not meet any criteria for admission today however did discuss with him the labs and the need for follow-up CBC in 1 to 2 weeks with his PCP.  Patient voices understanding.  He was given return precautions.  Stable for discharge at this time.  No social determinants affecting his discharge today.  Home health is not involved.   Images while in the hospital: CT Head Wo Contrast  Result Date: 03/01/2021 CLINICAL DATA:  Syncopal episode after  head trauma. EXAM: CT HEAD WITHOUT CONTRAST TECHNIQUE:  IMPRESSION: 1. Focal scalp edema/hematoma overlying the LEFT frontal bone. No underlying fracture. 2. No acute intracranial abnormality. No intracranial mass, hemorrhage or edema. Electronically Signed   By: Franki Cabot M.D.   On: 03/01/2021 13:56   Area of forehead healing well and pain has resolved from head injury on 03/01/21. Has very little bruising left from the fall.  He is currently  on Benicar- HCT 40/25 1/2 tab daily and cardura 4 mg 1/2 tab at bedtime. Denies any dizziness, chest pain, shortness of breath and headaches BP Readings from Last 3 Encounters:  03/09/21 120/82  03/01/21 134/83  11/26/20 118/68    He walks a lot . He is exercising twice a week, walking daily Pulse Readings from Last 3 Encounters:  03/09/21 (!) 46  03/01/21 (!) 54  11/26/20 (!) 52    BMI is Body mass index is 22.27 kg/m., he has been working on diet and exercise. Continues to eat well.  Has cut back on fatty foods.  He is not walking as much since he met his weight goal  Wt Readings from Last 3 Encounters:  03/09/21 168 lb 12.8 oz (76.6 kg)  03/01/21 170 lb (77.1 kg)  11/26/20 174 lb 12.8 oz (79.3 kg)     Current Outpatient Medications (Endocrine & Metabolic):    levothyroxine (SYNTHROID) 175 MCG tablet, TAKE 1 TABLET DAILY ON AN EMPTY STOMACH WITH ONLY WATER FOR 30 MINUTES AND NO ANTACID MEDICATIONS, CALCIUM, OR MAGNESIUM FOR 4 HOURS AND AVOID BIOTIN  Current Outpatient Medications (Cardiovascular):    doxazosin (CARDURA) 4 MG tablet, Take 1 tablet at Bedtime for BP & Prostate   olmesartan-hydrochlorothiazide (BENICAR HCT) 40-25 MG tablet, Take 1 tablet Daily for BP   Current Outpatient Medications (Analgesics):    BABY ASPIRIN PO, Take 81 mg by mouth daily.   Current Outpatient Medications (Other):    Cholecalciferol (VITAMIN D PO), Take 2,000 Units by mouth 2 times daily at 12 noon and 4 pm.   Flaxseed, Linseed, (FLAX SEED OIL PO), Take by mouth daily. Takes 2 caps daily   zinc gluconate 50 MG tablet, Take 50 mg by mouth daily.   triamcinolone cream (KENALOG) 0.5 %, Apply 1 application topically 2 (two) times daily. (Patient not taking: Reported on 02/11/2020)  Past Medical History:  Diagnosis Date   Allergy    Graves disease    pt. had radiation and is now on Thyroid medication    Hx of adenomatous colonic polyps 02/16/2017   Hyperlipidemia    not on meds  only LDL in the  past   Hypertension    Thyroid disease    Vitamin D deficiency      Allergies  Allergen Reactions   Latex     ROS: all negative except above.   Physical Exam: Filed Weights   03/09/21 1118  Weight: 168 lb 12.8 oz (76.6 kg)   BP 120/82    Pulse (!) 46    Temp (!) 96.4 F (35.8 C)    Wt 168 lb 12.8 oz (76.6 kg)    SpO2 99%    BMI 22.27 kg/m  General Appearance: Well nourished, in no apparent distress. Eyes: PERRLA, EOMs, conjunctiva no swelling or erythema Sinuses: No Frontal/maxillary tenderness ENT/Mouth: Ext aud canals clear, TMs without erythema, bulging. No erythema, swelling, or exudate on post pharynx.  Tonsils not swollen or erythematous. Hearing normal.  Neck: Supple, thyroid normal.  Respiratory: Respiratory effort normal, BS  equal bilaterally without rales, rhonchi, wheezing or stridor.  Cardio: RRR with no MRGs. Brisk peripheral pulses without edema.  Abdomen: Soft, + BS.  Non tender, no guarding, rebound, hernias, masses. Lymphatics: Non tender without lymphadenopathy.  Musculoskeletal: Full ROM, 5/5 strength, normal gait.  Skin: Warm, dry . Ecchymotic area resolving above left eye and on forehead Neuro: Cranial nerves intact. Normal muscle tone, no cerebellar symptoms. Sensation intact.  Psych: Awake and oriented X 3, normal affect, Insight and Judgment appropriate.     Magda Bernheim, NP 12:01 PM [facility]

## 2021-03-09 ENCOUNTER — Other Ambulatory Visit: Payer: Self-pay

## 2021-03-09 ENCOUNTER — Encounter: Payer: Self-pay | Admitting: Nurse Practitioner

## 2021-03-09 ENCOUNTER — Ambulatory Visit (INDEPENDENT_AMBULATORY_CARE_PROVIDER_SITE_OTHER): Payer: Medicare HMO | Admitting: Nurse Practitioner

## 2021-03-09 VITALS — BP 120/82 | HR 52 | Temp 96.4°F | Wt 168.8 lb

## 2021-03-09 DIAGNOSIS — R001 Bradycardia, unspecified: Secondary | ICD-10-CM | POA: Diagnosis not present

## 2021-03-09 DIAGNOSIS — D696 Thrombocytopenia, unspecified: Secondary | ICD-10-CM | POA: Diagnosis not present

## 2021-03-09 DIAGNOSIS — D72819 Decreased white blood cell count, unspecified: Secondary | ICD-10-CM

## 2021-03-09 DIAGNOSIS — E039 Hypothyroidism, unspecified: Secondary | ICD-10-CM | POA: Diagnosis not present

## 2021-03-09 DIAGNOSIS — S0990XD Unspecified injury of head, subsequent encounter: Secondary | ICD-10-CM | POA: Diagnosis not present

## 2021-03-10 ENCOUNTER — Other Ambulatory Visit: Payer: Self-pay | Admitting: Nurse Practitioner

## 2021-03-10 DIAGNOSIS — D72819 Decreased white blood cell count, unspecified: Secondary | ICD-10-CM

## 2021-03-10 LAB — CBC WITH DIFFERENTIAL/PLATELET
Absolute Monocytes: 328 cells/uL (ref 200–950)
Basophils Absolute: 49 cells/uL (ref 0–200)
Basophils Relative: 1.7 %
Eosinophils Absolute: 119 cells/uL (ref 15–500)
Eosinophils Relative: 4.1 %
HCT: 42.1 % (ref 38.5–50.0)
Hemoglobin: 14 g/dL (ref 13.2–17.1)
Lymphs Abs: 737 cells/uL — ABNORMAL LOW (ref 850–3900)
MCH: 30.9 pg (ref 27.0–33.0)
MCHC: 33.3 g/dL (ref 32.0–36.0)
MCV: 92.9 fL (ref 80.0–100.0)
MPV: 11.1 fL (ref 7.5–12.5)
Monocytes Relative: 11.3 %
Neutro Abs: 1668 cells/uL (ref 1500–7800)
Neutrophils Relative %: 57.5 %
Platelets: 180 10*3/uL (ref 140–400)
RBC: 4.53 10*6/uL (ref 4.20–5.80)
RDW: 12.1 % (ref 11.0–15.0)
Total Lymphocyte: 25.4 %
WBC: 2.9 10*3/uL — ABNORMAL LOW (ref 3.8–10.8)

## 2021-03-10 LAB — COMPLETE METABOLIC PANEL WITH GFR
AG Ratio: 1.5 (calc) (ref 1.0–2.5)
ALT: 17 U/L (ref 9–46)
AST: 24 U/L (ref 10–35)
Albumin: 4.1 g/dL (ref 3.6–5.1)
Alkaline phosphatase (APISO): 75 U/L (ref 35–144)
BUN: 13 mg/dL (ref 7–25)
CO2: 32 mmol/L (ref 20–32)
Calcium: 10 mg/dL (ref 8.6–10.3)
Chloride: 104 mmol/L (ref 98–110)
Creat: 1.07 mg/dL (ref 0.70–1.35)
Globulin: 2.7 g/dL (calc) (ref 1.9–3.7)
Glucose, Bld: 79 mg/dL (ref 65–99)
Potassium: 4.5 mmol/L (ref 3.5–5.3)
Sodium: 141 mmol/L (ref 135–146)
Total Bilirubin: 0.7 mg/dL (ref 0.2–1.2)
Total Protein: 6.8 g/dL (ref 6.1–8.1)
eGFR: 76 mL/min/{1.73_m2} (ref >=60)

## 2021-03-10 LAB — TSH: TSH: 0.5 mIU/L (ref 0.40–4.50)

## 2021-03-16 ENCOUNTER — Ambulatory Visit: Payer: Medicare HMO | Admitting: Internal Medicine

## 2021-03-16 NOTE — Progress Notes (Signed)
? ? ?  Future Appointments  ?Date Time Provider Department  ?03/17/2021 10:00 AM Unk Pinto, MD [facility]  ?03/24/2021  9:30 AM [facility] NURSE [facility]  ?05/26/2021        6 mo OV    9:30 AM Unk Pinto, MD [facility]  ? ? ?History of Present Illness: ? ?Patient is a very nice 66 yo MWM with HTN, HLD, Hypothyroidism Vit D Deficiency who  presents with c/o "pain in his  groin" - actually on exam , he was found to have a cyst in his Rt epididymis. He denies any injury.  ? ?Medications ? ?  levothyroxine  175 MCG tablet, TAKE 1 TABLET DAILY  ?  doxazosin 4 MG tablet, Take 1 tablet at Bedtime  ?  olmesartan-hctz  40-25 MG tablet, Take 1 tablet Daily for BP ?  BABY ASPIRIN 81 mg , Take  daily. ?  VITAMIN D, Take 2,000 Units 2 times daily at 12 noon and 4 pm. ?  FLAX SEED OIL , Takes 2 caps daily ?  zinc 50 MG tablet, Take daily. ? ?Problem list ?He has Hypertension; Hyperlipidemia; Hypothyroidism; Vitamin D deficiency; Medication management; Abnormal glucose; Obesity; Hx of adenomatous colonic polyps; and BPH associated with nocturia on their problem list. ?  ?Observations/Objective: ? ?BP 120/90   Pulse 67   Temp 97.9 ?F (36.6 ?C)   Resp 16   Ht '6\' 1"'$  (1.854 m)   Wt 167 lb 12.8 oz (76.1 kg)   SpO2 99%   BMI 22.14 kg/m?  ? ?Gu directed exam finds no inguinal hernias T Lt testis /cords unremarkable. Rt testis is soft and there is a slightly tender~ 15 mm cyst in the mid Rt epididymis.  ? ?Assessment and Plan: ? ? ?1. Epididymitis ? ?- doxycycline 100 MG capsule; ? Take 1 capsule 2 x /day with meal ?Dispense: 60 capsule ? ?- Ambulatory referral to Urology ? ?2. Epididymal cyst, Right  ? ?- Ambulatory referral to Urology ? ? ?Follow Up Instructions: ?  ?    I discussed the assessment and treatment plan with the patient. The patient was provided an opportunity to ask questions and all were answered. The patient agreed with the plan and demonstrated an understanding of the instructions. ?  ?    The patient  was advised to call back or seek an in-person evaluation if the symptoms worsen or if the condition fails to improve as anticipated. ? ? ? ?Kirtland Bouchard, MD ? ?

## 2021-03-16 NOTE — Progress Notes (Deleted)
? ? ?  Future Appointments  ?Date Time Provider Department  ?03/16/2021 11:30 AM Unk Pinto, MD [facility]  ?05/26/2021  9:30 AM Unk Pinto, MD [facility]  ? ? ?History of Present Illness: ? ?   This very nice 66 y.o. MWM with Hypertension, Hyperlipidemia, Pre-Diabetes and Vitamin D Deficiency presenting with pain in groin ? ? ? ?Medications ? ?  levothyroxine  175 MCG tablet, TAKE 1 TABLET DAILY  ?  doxazosin  4 MG tablet, Take 1 tablet at Bedtime for BP & Prostate ?  olmesartan-hctz 40-25 MG tablet, Take 1 tablet Daily for BP ?  BABY ASPIRIN PO, Take daily. ?  VITAMIN D , Take 2,000 Units 2 times daily  ?  FLAX SEED OIL , Takes 2 caps daily ?  zinc  50 MG tablet, Take  daily. ? ?Problem list ?He has Hypertension; Hyperlipidemia; Hypothyroidism; Vitamin D deficiency; Medication management; Abnormal glucose; Obesity; Hx of adenomatous colonic polyps; and BPH associated with nocturia on their problem list. ?  ?Observations/Objective: ? ?There were no vitals taken for this visit. ? ?HEENT - WNL. ?Neck - supple.  ?Chest - Clear equal BS. ?Cor - Nl HS. RRR w/o sig MGR. PP 1(+). No edema. ?MS- FROM w/o deformities.  Gait Nl. ?Neuro -  Nl w/o focal abnormalities. ? ? ?Assessment and Plan: ? ? ? ? ? ?Follow Up Instructions: ? ?  ?    I discussed the assessment and treatment plan with the patient. The patient was provided an opportunity to ask questions and all were answered. The patient agreed with the plan and demonstrated an understanding of the instructions. ?  ?    The patient was advised to call back or seek an in-person evaluation if the symptoms worsen or if the condition fails to improve as anticipated. ? ? ? ?Kirtland Bouchard, MD ? ?

## 2021-03-17 ENCOUNTER — Ambulatory Visit (INDEPENDENT_AMBULATORY_CARE_PROVIDER_SITE_OTHER): Payer: Medicare HMO | Admitting: Internal Medicine

## 2021-03-17 ENCOUNTER — Encounter: Payer: Self-pay | Admitting: Internal Medicine

## 2021-03-17 ENCOUNTER — Other Ambulatory Visit: Payer: Self-pay

## 2021-03-17 VITALS — BP 120/90 | HR 67 | Temp 97.9°F | Resp 16 | Ht 73.0 in | Wt 167.8 lb

## 2021-03-17 DIAGNOSIS — N451 Epididymitis: Secondary | ICD-10-CM

## 2021-03-17 DIAGNOSIS — N503 Cyst of epididymis: Secondary | ICD-10-CM

## 2021-03-17 MED ORDER — DOXYCYCLINE HYCLATE 100 MG PO CAPS: ORAL_CAPSULE | ORAL | 0 refills | Status: DC

## 2021-03-17 NOTE — Patient Instructions (Signed)
Epididymitis Epididymitis is inflammation or swelling of the epididymis. This is caused by an infection. The epididymis is a cord-like structure that is located along the top and back part of the testicle. It collects and stores sperm from the testicle. This condition can also cause pain and swelling of the testicle and scrotum. Symptoms usually start suddenly (acute epididymitis). Sometimes epididymitis starts gradually and lasts for a while (chronic epididymitis). Chronic epididymitis may be harder to treat. What are the causes? In men ages 78-40, this condition is usually caused by a bacterial infection or a sexually transmitted infection (STI), such as gonorrhea or chlamydia. In men 76 and older, this condition is usually caused by bacteria from a urinary blockage or from abnormalities in the urinary system. These can result from: Having a tube placed into the bladder (urinary catheter). Having an enlarged or inflamed prostate gland. Having recently had urinary tract surgery. Having a problem with a backward flow of urine (retrograde). In men who have a condition that weakens the body's defense system (immune system), such as human immunodeficiency virus (HIV), this condition can be caused by: Other bacteria, including tuberculosis and syphilis. Viruses. Fungi. Sometimes this condition occurs without infection. This may happen because of trauma or repetitive activities such as sports. What increases the risk? You are more likely to develop this condition if you have: Unprotected sex with more than one partner. Anal sex. Had recent surgery. A urinary catheter. Urinary problems. A suppressed immune system. What are the signs or symptoms? This condition usually begins suddenly with chills, fever, and pain behind the scrotum and in the testicle. Other symptoms include: Swelling of the scrotum, testicle, or both. Pain when ejaculating or urinating. Pain in the back or  abdomen. Nausea. Itching and discharge from the penis. A frequent need to pass urine. Redness, increased warmth, and tenderness of the scrotum. How is this diagnosed? Your health care provider can diagnose this condition based on your symptoms and medical history. Your health care provider will also do a physical exam to check your scrotum and testicle for swelling, pain, and redness. You may also have other tests, including: Testing of discharge from the penis. Testing your urine for infections, such as STIs. Ultrasound to check for blood flow and inflammation. Your health care provider may test you for other STIs, including HIV. How is this treated? Treatment for this condition depends on the cause. If your condition is caused by a bacterial infection, oral antibiotic medicine may be prescribed. If the bacterial infection has spread to your blood, you may need to receive IV antibiotics. For both bacterial and nonbacterial epididymitis, you may be treated with: Rest. Elevation of the scrotum. Pain medicines. Anti-inflammatory medicines. Surgery may be needed if: You have pus buildup in the scrotum (abscess). You have epididymitis that has not responded to other treatments. Follow these instructions at home: Medicines Take over-the-counter and prescription medicines only as told by your health care provider. If you were prescribed an antibiotic medicine, take it as told by your health care provider. Do not stop taking the antibiotic even if your condition improves. Sexual activity If your epididymitis was caused by an STI, avoid sexual activity until your treatment is complete. Inform your sexual partner or partners if you test positive for an STI. They may need to be treated. Do not engage in sexual activity with your partner or partners until their treatment is completed. Managing pain and swelling  If directed, raise (elevate) your scrotum and apply ice. To  do this: Put ice in a  plastic bag. Place a small towel or pillow between your legs. Rest your scrotum on the pillow or towel. Place another towel between your skin and the plastic bag. Leave the ice on for 20 minutes, 2-3 times a day. Remove the ice if your skin turns bright red. This is very important. If you cannot feel pain, heat, or cold, you have a greater risk of damage to the area. Keep your scrotum elevated and supported while resting. Ask your health care provider if you should wear a scrotal support, such as a jockstrap. Wear it as told by your health care provider. Try taking a sitz bath to help with discomfort. This is a warm water bath that is taken while you are sitting down. The water should come up to your hips and should cover your buttocks. Do this 3-4 times per day or as told by your health care provider. General instructions Drink enough fluid to keep your urine pale yellow. Return to your normal activities as told by your health care provider. Ask your health care provider what activities are safe for you. Keep all follow-up visits. This is important. Contact a health care provider if: You have a fever. Your pain medicine is not helping. Your pain is getting worse. Your symptoms do not improve within 3 days. Summary Epididymitis is inflammation or swelling of the epididymis. This is caused by an infection. This condition can also cause pain and swelling of the testicle and scrotum. Treatment for this condition depends on the cause. If your condition is caused by a bacterial infection, oral antibiotic medicine may be prescribed. Inform your sexual partner or partners if you test positive for an STI. They may need to be treated. Do not engage in sexual activity with your partner or partners until their treatment is completed. Contact a health care provider if your symptoms do not improve within 3 days. This information is not intended to replace advice given to you by your health care provider.  Make sure you discuss any questions you have with your health care provider. ======================================  Spermatocele A spermatocele is a fluid-filled sac (cyst) inside the sac that holds the testicles (scrotum). This type of cyst often forms in the epididymis. The epididymis is a coiled tube at the top of each testicle, and this tube is where sperm are stored. The cyst sometimes forms along a tube called the vas deferens, which is a tube that carries sperm away from the epididymis. Spermatoceles are usually painless. Most cysts are small, but they can grow larger. Spermatoceles are not cancerous (are benign). What are the causes? The cause of this condition is not known. However, this condition usually results from a blockage in one of the many small tubes (tubules) that carry sperm from your testicle to your vas deferens. What are the signs or symptoms? In most cases, small cysts do not cause symptoms. However, symptoms sometimes occur. Symptoms of this condition include: Dull pain. A feeling of heaviness. An enlarged scrotum, if your cyst is large. How is this diagnosed? This condition is diagnosed based on a physical exam. You or your health care provider may notice your cyst when feeling your scrotum. Your health care provider may shine a light through (transilluminate) your scrotum to see if light will pass through your cyst. You may have an ultrasound of the scrotum to rule out a tumor. How is this treated? Small spermatoceles do not need to be treated. If your  spermatocele has grown large or is uncomfortable, your health care provider may recommend surgery to remove it. Follow these instructions at home: Check your spermatocele regularly for any changes. Do regular self-exams of your scrotum. Keep all follow-up visits. This is important. Contact a health care provider if: Your spermatocele gets larger. You have pain in your scrotum. Your spermatocele comes back after  treatment. Get help right away if: You experience severe pain and redness of your scrotum. Summary A spermatocele is a fluid-filled sac, or a cyst, inside the sac that holds the testicles (scrotum). This condition is usually painless, and it is not cancerous (is benign). Your health care provider may recommend surgery to remove your spermatocele if it grows large or is uncomfortable. If you have a spermatocele, check for any changes and do self-exams of your scrotum. Keep all follow-up visits. This is important. This information is not intended to replace advice given to you by your health care provider. Make sure you discuss any questions you have with your health care provider. Document Revised: 08/17/2020 Document Reviewed: 08/17/2020 Elsevier Patient Education  [place].

## 2021-03-24 ENCOUNTER — Ambulatory Visit (INDEPENDENT_AMBULATORY_CARE_PROVIDER_SITE_OTHER): Payer: Medicare HMO

## 2021-03-24 ENCOUNTER — Other Ambulatory Visit: Payer: Self-pay

## 2021-03-24 DIAGNOSIS — D72819 Decreased white blood cell count, unspecified: Secondary | ICD-10-CM | POA: Diagnosis not present

## 2021-03-24 LAB — CBC WITH DIFFERENTIAL/PLATELET
Absolute Monocytes: 396 cells/uL (ref 200–950)
Basophils Absolute: 49 cells/uL (ref 0–200)
Basophils Relative: 1.4 %
Eosinophils Absolute: 203 cells/uL (ref 15–500)
Eosinophils Relative: 5.8 %
HCT: 41.7 % (ref 38.5–50.0)
Hemoglobin: 13.8 g/dL (ref 13.2–17.1)
Lymphs Abs: 991 cells/uL (ref 850–3900)
MCH: 30.9 pg (ref 27.0–33.0)
MCHC: 33.1 g/dL (ref 32.0–36.0)
MCV: 93.3 fL (ref 80.0–100.0)
MPV: 10.9 fL (ref 7.5–12.5)
Monocytes Relative: 11.3 %
Neutro Abs: 1862 cells/uL (ref 1500–7800)
Neutrophils Relative %: 53.2 %
Platelets: 176 10*3/uL (ref 140–400)
RBC: 4.47 10*6/uL (ref 4.20–5.80)
RDW: 12.3 % (ref 11.0–15.0)
Total Lymphocyte: 28.3 %
WBC: 3.5 10*3/uL — ABNORMAL LOW (ref 3.8–10.8)

## 2021-03-24 NOTE — Progress Notes (Signed)
Patient presents to the office for a nurse visit. Today, we are rechecking a CBC for decreased white blood count. Vitals taken and recorded. No questions or concerns.  ?

## 2021-03-30 ENCOUNTER — Encounter: Payer: Self-pay | Admitting: Internal Medicine

## 2021-04-13 DIAGNOSIS — N4340 Spermatocele of epididymis, unspecified: Secondary | ICD-10-CM | POA: Diagnosis not present

## 2021-04-13 DIAGNOSIS — R3911 Hesitancy of micturition: Secondary | ICD-10-CM | POA: Diagnosis not present

## 2021-04-23 DIAGNOSIS — H52209 Unspecified astigmatism, unspecified eye: Secondary | ICD-10-CM | POA: Diagnosis not present

## 2021-04-23 DIAGNOSIS — H5213 Myopia, bilateral: Secondary | ICD-10-CM | POA: Diagnosis not present

## 2021-04-23 DIAGNOSIS — H5203 Hypermetropia, bilateral: Secondary | ICD-10-CM | POA: Diagnosis not present

## 2021-04-23 DIAGNOSIS — H5212 Myopia, left eye: Secondary | ICD-10-CM | POA: Diagnosis not present

## 2021-05-12 DIAGNOSIS — Z7982 Long term (current) use of aspirin: Secondary | ICD-10-CM | POA: Diagnosis not present

## 2021-05-12 DIAGNOSIS — Z91040 Latex allergy status: Secondary | ICD-10-CM | POA: Diagnosis not present

## 2021-05-12 DIAGNOSIS — E039 Hypothyroidism, unspecified: Secondary | ICD-10-CM | POA: Diagnosis not present

## 2021-05-12 DIAGNOSIS — I10 Essential (primary) hypertension: Secondary | ICD-10-CM | POA: Diagnosis not present

## 2021-05-12 DIAGNOSIS — Z008 Encounter for other general examination: Secondary | ICD-10-CM | POA: Diagnosis not present

## 2021-05-12 DIAGNOSIS — Z87891 Personal history of nicotine dependence: Secondary | ICD-10-CM | POA: Diagnosis not present

## 2021-05-26 ENCOUNTER — Ambulatory Visit (INDEPENDENT_AMBULATORY_CARE_PROVIDER_SITE_OTHER): Payer: Medicare HMO | Admitting: Nurse Practitioner

## 2021-05-26 ENCOUNTER — Encounter: Payer: Self-pay | Admitting: Nurse Practitioner

## 2021-05-26 VITALS — BP 120/80 | HR 64 | Temp 97.7°F | Wt 179.8 lb

## 2021-05-26 DIAGNOSIS — R351 Nocturia: Secondary | ICD-10-CM

## 2021-05-26 DIAGNOSIS — K4090 Unilateral inguinal hernia, without obstruction or gangrene, not specified as recurrent: Secondary | ICD-10-CM | POA: Diagnosis not present

## 2021-05-26 DIAGNOSIS — R7309 Other abnormal glucose: Secondary | ICD-10-CM | POA: Diagnosis not present

## 2021-05-26 DIAGNOSIS — Z0000 Encounter for general adult medical examination without abnormal findings: Secondary | ICD-10-CM

## 2021-05-26 DIAGNOSIS — Z79899 Other long term (current) drug therapy: Secondary | ICD-10-CM | POA: Diagnosis not present

## 2021-05-26 DIAGNOSIS — Z86010 Personal history of colonic polyps: Secondary | ICD-10-CM | POA: Diagnosis not present

## 2021-05-26 DIAGNOSIS — E785 Hyperlipidemia, unspecified: Secondary | ICD-10-CM | POA: Diagnosis not present

## 2021-05-26 DIAGNOSIS — I10 Essential (primary) hypertension: Secondary | ICD-10-CM | POA: Diagnosis not present

## 2021-05-26 DIAGNOSIS — Z0001 Encounter for general adult medical examination with abnormal findings: Secondary | ICD-10-CM | POA: Diagnosis not present

## 2021-05-26 DIAGNOSIS — E039 Hypothyroidism, unspecified: Secondary | ICD-10-CM

## 2021-05-26 DIAGNOSIS — E559 Vitamin D deficiency, unspecified: Secondary | ICD-10-CM | POA: Diagnosis not present

## 2021-05-26 DIAGNOSIS — N401 Enlarged prostate with lower urinary tract symptoms: Secondary | ICD-10-CM

## 2021-05-26 DIAGNOSIS — R6889 Other general symptoms and signs: Secondary | ICD-10-CM

## 2021-05-26 DIAGNOSIS — E6609 Other obesity due to excess calories: Secondary | ICD-10-CM

## 2021-05-26 MED ORDER — DOXAZOSIN MESYLATE 2 MG PO TABS: ORAL_TABLET | ORAL | 3 refills | Status: DC

## 2021-05-26 MED ORDER — OLMESARTAN MEDOXOMIL-HCTZ 20-12.5 MG PO TABS: ORAL_TABLET | ORAL | 3 refills | Status: DC

## 2021-05-26 MED ORDER — LEVOTHYROXINE SODIUM 175 MCG PO TABS: ORAL_TABLET | ORAL | 3 refills | Status: DC

## 2021-05-26 NOTE — Patient Instructions (Signed)
Inguinal Hernia, Adult An inguinal hernia is when fat or your intestines push through a weak spot in a muscle where your leg meets your lower belly (groin). This causes a bulge. This kind of hernia could also be: In your scrotum, if you are male. In folds of skin around your vagina, if you are male. There are three types of inguinal hernias: Hernias that can be pushed back into the belly (are reducible). This type rarely causes pain. Hernias that cannot be pushed back into the belly (are incarcerated). Hernias that cannot be pushed back into the belly and lose their blood supply (are strangulated). This type needs emergency surgery. What are the causes? This condition is caused by having a weak spot in the muscles or tissues in your groin. This develops over time. The hernia may poke through the weak spot when you strain your lower belly muscles all of a sudden, such as when you: Lift a heavy object. Strain to poop (have a bowel movement). Trouble pooping (constipation) can lead to straining. Cough. What increases the risk? This condition is more likely to develop in: Males. Pregnant females. People who: Are overweight. Work in jobs that require long periods of standing or heavy lifting. Have had an inguinal hernia before. Smoke or have lung disease. These factors can lead to long-term (chronic) coughing. What are the signs or symptoms? Symptoms may depend on the size of the hernia. Often, a small hernia has no symptoms. Symptoms of a larger hernia may include: A bulge in the groin area. This is easier to see when standing. You might not be able to see it when you are lying down. Pain or burning in the groin. This may get worse when you lift, strain, or cough. A dull ache or a feeling of pressure in the groin. An abnormal bulge in the scrotum, in males. Symptoms of a strangulated inguinal hernia may include: A bulge in your groin that is very painful and tender to the touch. A bulge  that turns red or purple. Fever, feeling like you may vomit (nausea), and vomiting. Not being able to poop or to pass gas. How is this treated? Treatment depends on the size of your hernia and whether you have symptoms. If you do not have symptoms, your doctor may have you watch your hernia carefully and have you come in for follow-up visits. If your hernia is large or if you have symptoms, you may need surgery to repair the hernia. Follow these instructions at home: Lifestyle Avoid lifting heavy objects. Avoid standing for long amounts of time. Do not smoke or use any products that contain nicotine or tobacco. If you need help quitting, ask your doctor. Stay at a healthy weight. Prevent trouble pooping You may need to take these actions to prevent or treat trouble pooping: Drink enough fluid to keep your pee (urine) pale yellow. Take over-the-counter or prescription medicines. Eat foods that are high in fiber. These include beans, whole grains, and fresh fruits and vegetables. Limit foods that are high in fat and sugar. These include fried or sweet foods. General instructions You may try to push your hernia back in place by very gently pressing on it when you are lying down. Do not try to push the bulge back in if it will not go in easily. Watch your hernia for any changes in shape, size, or color. Tell your doctor if you see any changes. Take over-the-counter and prescription medicines only as told by your doctor. Keep   all follow-up visits. Contact a doctor if: You have a fever or chills. You have new symptoms. Your symptoms get worse. Get help right away if: You have pain in your groin that gets worse all of a sudden. You have a bulge in your groin that: Gets bigger all of a sudden, and it does not get smaller after that. Turns red or purple. Is painful when you touch it. You are a male, and you have: Sudden pain in your scrotum. A sudden change in the size of your scrotum. You  cannot push the hernia back in place by very gently pressing on it when you are lying down. You feel like you may vomit, and that feeling does not go away. You keep vomiting. You have a fast heartbeat. You cannot poop or pass gas. These symptoms may be an emergency. Get help right away. Call your local emergency services (911 in the [place] Do not wait to see if the symptoms will go away. Do not drive yourself to the hospital. Summary An inguinal hernia is when fat or your intestines push through a weak spot in a muscle where your leg meets your lower belly (groin). This causes a bulge. If you do not have symptoms, you may not need treatment. If you have symptoms or a large hernia, you may need surgery. Avoid lifting heavy objects. Also, avoid standing for long amounts of time. Do not try to push the bulge back in if it will not go in easily. This information is not intended to replace advice given to you by your health care provider. Make sure you discuss any questions you have with your health care provider. Document Revised: 08/27/2019 Document Reviewed: 08/27/2019 Elsevier Patient Education  2023 [employer]  

## 2021-05-26 NOTE — Progress Notes (Signed)
MEDICARE ANNUAL WELLNESS VISIT AND FOLLOW UP  1. Initial Medicare annual wellness visit Due Annually  2. Primary hypertension Continue medications. Discussed lifestyle modifications  - CBC with Differential/Platelet  3. Hypothyroidism, unspecified type Continue medication. Continue to monitor.  - TSH - levothyroxine (SYNTHROID) 175 MCG tablet; TAKE 1 TABLET DAILY ON AN EMPTY STOMACH WITH ONLY WATER FOR 30 MINUTES AND NO ANTACID MEDICATIONS, CALCIUM, OR MAGNESIUM FOR 4 HOURS AND AVOID BIOTIN  Dispense: 90 tablet; Refill: 3  4. Hyperlipidemia, unspecified hyperlipidemia type Controlled. Continue lifestyle modifications. Weight management, exercise, remain active. Discussed low cholesterol diet, decrease fatty, trans fat foods, incorporate fruits veggies, omega 3's.   - CBC with Differential/Platelet - COMPLETE METABOLIC PANEL WITH GFR - Lipid panel  5. Vitamin D deficiency Controlled. Continue to monitor.  - VITAMIN D 25 Hydroxy (Vit-D Deficiency, Fractures)  6. Abnormal glucose Discussed lifestyle modifications. Monitor intake of carbohydrates, starches, sugars. Weight management, exercise, remain active.  7. Obesity due to excess calories, unspecified classification, unspecified whether serious comorbidity present Discussed weight management Remain active. Monitor diet.   8. Hx of adenomatous colonic polyps Continue colon cancer screening. Continue to monitor.  9. BPH associated with nocturia Discussed Flomax for symptom control Following Urology.   Continue to monitor.  10. Right inguinal hernia Discussed [place] and referral to General Surgery if s/s fail to improve. Continue to monitor for strangulations, s/s discussed an reviewed including changes in color of skin, growth, uncontrolled pain. Continue to monitor.  11. Medication management Medications dicussed an reviewed in detail. All questions and concerns addressed.  - CBC with Differential/Platelet -  COMPLETE METABOLIC PANEL WITH GFR - VITAMIN D 25 Hydroxy (Vit-D Deficiency, Fractures) - Lipid panel - TSH  12. Essential hypertension Controlled. Continue medications. Continue to monitor.  - doxazosin (CARDURA) 2 MG tablet; Take 1 tablet at Bedtime for BP & Prostate  Dispense: 90 tablet; Refill: 3   Over 30 minutes of exam, counseling, chart review, and critical decision making was performed  Future Appointments  Date Time Provider [place]  11/29/2021  9:00 AM Darrol Jump, NP [facility] None     Plan:   During the course of the visit the patient was educated and counseled about appropriate screening and preventive services including:   Pneumococcal vaccine  Influenza vaccine Prevnar 13 Td vaccine Screening electrocardiogram Colorectal cancer screening Diabetes screening Glaucoma screening Nutrition counseling    Subjective:  Julian Lee is a 66 y.o. male who presents for Medicare Annual Wellness Visit and 3 month follow up. He has Hypertension; Hyperlipidemia; Hypothyroidism; Vitamin D deficiency; Medication management; Abnormal glucose; Obesity; Hx of adenomatous colonic polyps; and BPH associated with nocturia on their problem list.  He is concerned for right inguinal pain that has increased over the last three months.  Most notabley when walking. Pain concentrated in the center of the perineum. He is followed by Urology has BPH with nocturia.  Has not had referral to General Surgery for review of hernia.   BMI is Body mass index is 23.72 kg/m., he has not been working on diet and exercise. Wt Readings from Last 3 Encounters:  05/26/21 179 lb 12.8 oz (81.6 kg)  03/24/21 171 lb 6.4 oz (77.7 kg)  03/17/21 167 lb 12.8 oz (76.1 kg)     His blood pressure has been controlled at home, today their BP is BP: 120/80 He does workout. He denies chest pain, shortness of breath, dizziness.  He is on cholesterol medication and denies myalgias. His  cholesterol is at goal. The cholesterol last visit was:   Lab Results  Component Value Date   CHOL 147 11/26/2020   HDL 56 11/26/2020   LDLCALC 69 11/26/2020   TRIG 134 11/26/2020   CHOLHDL 2.6 11/26/2020   He has been working on diet and exercise for prediabetes, and denies nausea, polydipsia, and polyuria. Last A1C in the office was:  Lab Results  Component Value Date   HGBA1C 4.7 11/26/2020   Last GFR Lab Results  Component Value Date   EGFR 76 03/09/2021   Patient is on Vitamin D supplement.   Lab Results  Component Value Date   VD25OH 136 (H) 11/26/2020      Medication Review:  Current Outpatient Medications (Endocrine & Metabolic):    levothyroxine (SYNTHROID) 175 MCG tablet, TAKE 1 TABLET DAILY ON AN EMPTY STOMACH WITH ONLY WATER FOR 30 MINUTES AND NO ANTACID MEDICATIONS, CALCIUM, OR MAGNESIUM FOR 4 HOURS AND AVOID BIOTIN  Current Outpatient Medications (Cardiovascular):    olmesartan-hydrochlorothiazide (BENICAR HCT) 20-12.5 MG tablet, Take 1 tablet Daily for BP   olmesartan-hydrochlorothiazide (BENICAR HCT) 40-25 MG tablet, Take 1 tablet Daily for BP   doxazosin (CARDURA) 2 MG tablet, Take 1 tablet at Bedtime for BP & Prostate   Current Outpatient Medications (Analgesics):    BABY ASPIRIN PO, Take 81 mg by mouth daily.   Current Outpatient Medications (Other):    Ascorbic Acid (VITAMIN C) 1000 MG tablet, Take 1,000 mg by mouth daily.   Cholecalciferol (VITAMIN D PO), Take 2,000 Units by mouth 2 times daily at 12 noon and 4 pm.   Flaxseed, Linseed, (FLAX SEED OIL PO), Take by mouth daily. Takes 2 caps daily   Glucosamine HCl (GLUCOSAMINE PO), Take by mouth. 1064m   Magnesium 250 MG TABS, Take by mouth.   Multiple Vitamins-Minerals (MULTIVITAMIN ADULTS 50+ PO), Take by mouth.   triamcinolone cream (KENALOG) 0.5 %, Apply 1 application topically 2 (two) times daily.   zinc gluconate 50 MG tablet, Take 50 mg by mouth daily.   doxycycline (VIBRAMYCIN) 100 MG  capsule, Take 1 capsule 2 x /day with meals for Infection (Patient not taking: Reported on 05/26/2021)  Allergies: Allergies  Allergen Reactions   Latex     Current Problems (verified) has Hypertension; Hyperlipidemia; Hypothyroidism; Vitamin D deficiency; Medication management; Abnormal glucose; Obesity; Hx of adenomatous colonic polyps; and BPH associated with nocturia on their problem list.  Screening Tests Immunization History  Administered Date(s) Administered   Influenza Inj Mdck Quad With Preservative 11/08/2016, 11/07/2017   Influenza Split 09/26/2012   Influenza, High Dose Seasonal PF 11/12/2018, 09/23/2020   Influenza-Unspecified 11/07/2016, 11/07/2017   Moderna Covid-19 Vaccine Bivalent Booster 120yr& up 10/12/2020   PFIZER(Purple Top)SARS-COV-2 Vaccination 04/04/2019, 04/29/2019   PPD Test 04/01/2013   Pneumococcal Conjugate-13 11/12/2018   Pneumococcal Polysaccharide-23 09/16/2008   Td 08/22/2006   Tdap 11/08/2016   Zoster Recombinat (Shingrix) 08/04/2020, 10/12/2020   Zoster, Live 01/10/2006   Health Maintenance  Topic Date Due   Pneumonia Vaccine 6520Years old (3) 11/12/2019   COVID-19 Vaccine (3 - Pfizer risk series) 10/12/2020   INFLUENZA VACCINE  08/10/2021   COLONOSCOPY (Pts 45-4963yrnsurance coverage will need to be confirmed)  02/24/2025   TETANUS/TDAP  11/09/2026   Hepatitis C Screening  Completed   Zoster Vaccines- Shingrix  Completed   HPV VACCINES  Aged Out   Fecal DNA (Cologuard)  Discontinued    Last colonoscopy: 02/2020   Names of Other Physician/Practitioners you currently use:  1. [facility] here for primary care 2. Due, eye doctor, last visit  3. Due, dentist, last visit  4. Due, derm, last visit   Patient Care Team: Unk Pinto, MD as PCP - General (Internal Medicine) Gatha Mayer, MD as Consulting Physician (Gastroenterology) Crista Luria, MD as Consulting Physician  (Dermatology)  Surgical: He  has a past surgical history that includes Tonsillectomy and adenoidectomy and Colonoscopy (2019). Family His family history includes Cancer in his mother; Heart attack in his father; Rheum arthritis in his mother. Social history  He reports that he quit smoking about 16 years ago. His smoking use included cigarettes. He quit smokeless tobacco use about 31 years ago. He reports current alcohol use. He reports that he does not use drugs.  MEDICARE WELLNESS OBJECTIVES: Physical activity: Current Exercise Habits: Home exercise routine, Type of exercise: walking, Time (Minutes): 20, Frequency (Times/Week): 4, Weekly Exercise (Minutes/Week): 80, Exercise limited by: Other - see comments Cardiac risk factors:   Depression/mood screen:      05/26/2021    9:51 AM  Depression screen PHQ 2/9  Decreased Interest 0  Down, Depressed, Hopeless 0  PHQ - 2 Score 0    ADLs:     05/26/2021    9:52 AM  In your present state of health, do you have any difficulty performing the following activities:  Hearing? 0  Vision? 0  Difficulty concentrating or making decisions? 0  Walking or climbing stairs? 0  Dressing or bathing? 0  Doing errands, shopping? 0  Preparing Food and eating ? N  Using the Toilet? N  In the past six months, have you accidently leaked urine? N  Do you have problems with loss of bowel control? N  Managing your Medications? N  Managing your Finances? N  Housekeeping or managing your Housekeeping? N     Cognitive Testing  Alert? Yes  Normal Appearance?Yes  Oriented to person? Yes  Place? Yes   Time? Yes  Recall of three objects?  Yes  Can perform simple calculations? Yes  Displays appropriate judgment?Yes  Can read the correct time from a watch face?Yes  EOL planning: Does Patient Have a Medical Advance Directive?: No Would patient like information on creating a medical advance directive?: No - Patient declined   Objective:   Today's Vitals    05/26/21 0920  BP: 120/80  Pulse: 64  Temp: 97.7 F (36.5 C)  SpO2: 96%  Weight: 179 lb 12.8 oz (81.6 kg)   Body mass index is 23.72 kg/m.  General appearance: alert, no distress, WD/WN, male HEENT: normocephalic, sclerae anicteric, TMs pearly, nares patent, no discharge or erythema, pharynx normal Oral cavity: MMM, no lesions Neck: supple, no lymphadenopathy, no thyromegaly, no masses Heart: RRR, normal S1, S2, no murmurs Lungs: CTA bilaterally, no wheezes, rhonchi, or rales Abdomen: +bs, soft, non tender, non distended, no masses, no hepatomegaly, no splenomegaly Musculoskeletal: nontender, no swelling, no obvious deformity Extremities: no edema, no cyanosis, no clubbing Pulses: 2+ symmetric, upper and lower extremities, normal cap refill Neurological: alert, oriented x 3, CN2-12 intact, strength normal upper extremities and lower extremities, sensation normal throughout, DTRs 2+ throughout, no cerebellar signs, gait normal Psychiatric: normal affect, behavior normal, pleasant   EKG: Due Annual Physical AAA [place]: Due Annual Physical   Medicare Attestation I have personally reviewed: The patient's medical and social history Their use of alcohol, tobacco or illicit drugs Their current medications and supplements The patient's functional ability including ADLs,fall risks, home  safety risks, cognitive, and hearing and visual impairment Diet and physical activities Evidence for depression or mood disorders  The patient's weight, height, BMI, and visual acuity have been recorded in the chart.  I have made referrals, counseling, and provided education to the patient based on review of the above and I have provided the patient with a written personalized care plan for preventive services.     Darrol Jump, NP   05/26/2021

## 2021-05-27 ENCOUNTER — Encounter: Payer: Self-pay | Admitting: Nurse Practitioner

## 2021-05-27 DIAGNOSIS — E039 Hypothyroidism, unspecified: Secondary | ICD-10-CM

## 2021-05-27 LAB — CBC WITH DIFFERENTIAL/PLATELET
Absolute Monocytes: 428 cells/uL (ref 200–950)
Basophils Absolute: 71 cells/uL (ref 0–200)
Basophils Relative: 1.7 %
Eosinophils Absolute: 386 cells/uL (ref 15–500)
Eosinophils Relative: 9.2 %
HCT: 44.9 % (ref 38.5–50.0)
Hemoglobin: 15.4 g/dL (ref 13.2–17.1)
Lymphs Abs: 853 cells/uL (ref 850–3900)
MCH: 32 pg (ref 27.0–33.0)
MCHC: 34.3 g/dL (ref 32.0–36.0)
MCV: 93.3 fL (ref 80.0–100.0)
MPV: 10.6 fL (ref 7.5–12.5)
Monocytes Relative: 10.2 %
Neutro Abs: 2461 cells/uL (ref 1500–7800)
Neutrophils Relative %: 58.6 %
Platelets: 196 10*3/uL (ref 140–400)
RBC: 4.81 10*6/uL (ref 4.20–5.80)
RDW: 11.8 % (ref 11.0–15.0)
Total Lymphocyte: 20.3 %
WBC: 4.2 10*3/uL (ref 3.8–10.8)

## 2021-05-27 LAB — COMPLETE METABOLIC PANEL WITH GFR
AG Ratio: 1.5 (calc) (ref 1.0–2.5)
ALT: 19 U/L (ref 9–46)
AST: 21 U/L (ref 10–35)
Albumin: 4.1 g/dL (ref 3.6–5.1)
Alkaline phosphatase (APISO): 76 U/L (ref 35–144)
BUN: 15 mg/dL (ref 7–25)
CO2: 30 mmol/L (ref 20–32)
Calcium: 10 mg/dL (ref 8.6–10.3)
Chloride: 104 mmol/L (ref 98–110)
Creat: 1.07 mg/dL (ref 0.70–1.35)
Globulin: 2.7 g/dL (calc) (ref 1.9–3.7)
Glucose, Bld: 89 mg/dL (ref 65–99)
Potassium: 4.5 mmol/L (ref 3.5–5.3)
Sodium: 141 mmol/L (ref 135–146)
Total Bilirubin: 0.6 mg/dL (ref 0.2–1.2)
Total Protein: 6.8 g/dL (ref 6.1–8.1)
eGFR: 76 mL/min/{1.73_m2} (ref >=60)

## 2021-05-27 LAB — LIPID PANEL
Cholesterol: 155 mg/dL (ref <200)
HDL: 50 mg/dL (ref >=40)
LDL Cholesterol (Calc): 85 mg/dL (calc)
Non-HDL Cholesterol (Calc): 105 mg/dL (calc) (ref <130)
Total CHOL/HDL Ratio: 3.1 (calc) (ref <5.0)
Triglycerides: 104 mg/dL (ref <150)

## 2021-05-27 LAB — VITAMIN D 25 HYDROXY (VIT D DEFICIENCY, FRACTURES): Vit D, 25-Hydroxy: 98 ng/mL (ref 30–100)

## 2021-05-27 LAB — TSH: TSH: 0.15 mIU/L — ABNORMAL LOW (ref 0.40–4.50)

## 2021-06-10 ENCOUNTER — Other Ambulatory Visit: Payer: Medicare HMO

## 2021-06-10 DIAGNOSIS — E039 Hypothyroidism, unspecified: Secondary | ICD-10-CM

## 2021-06-11 LAB — TSH: TSH: 0.72 mIU/L (ref 0.40–4.50)

## 2021-07-15 DIAGNOSIS — N4340 Spermatocele of epididymis, unspecified: Secondary | ICD-10-CM | POA: Diagnosis not present

## 2021-07-15 DIAGNOSIS — Z125 Encounter for screening for malignant neoplasm of prostate: Secondary | ICD-10-CM | POA: Diagnosis not present

## 2021-09-08 DIAGNOSIS — H2513 Age-related nuclear cataract, bilateral: Secondary | ICD-10-CM | POA: Diagnosis not present

## 2021-09-08 DIAGNOSIS — H40003 Preglaucoma, unspecified, bilateral: Secondary | ICD-10-CM | POA: Diagnosis not present

## 2021-09-21 DIAGNOSIS — H25811 Combined forms of age-related cataract, right eye: Secondary | ICD-10-CM | POA: Diagnosis not present

## 2021-09-21 DIAGNOSIS — H40061 Primary angle closure without glaucoma damage, right eye: Secondary | ICD-10-CM | POA: Diagnosis not present

## 2021-09-21 DIAGNOSIS — H25812 Combined forms of age-related cataract, left eye: Secondary | ICD-10-CM | POA: Diagnosis not present

## 2021-11-08 DIAGNOSIS — H25812 Combined forms of age-related cataract, left eye: Secondary | ICD-10-CM | POA: Diagnosis not present

## 2021-11-08 DIAGNOSIS — H269 Unspecified cataract: Secondary | ICD-10-CM | POA: Diagnosis not present

## 2021-11-29 ENCOUNTER — Encounter: Payer: Self-pay | Admitting: Nurse Practitioner

## 2021-11-29 ENCOUNTER — Ambulatory Visit (INDEPENDENT_AMBULATORY_CARE_PROVIDER_SITE_OTHER): Payer: Medicare HMO | Admitting: Nurse Practitioner

## 2021-11-29 VITALS — BP 160/98 | HR 63 | Temp 97.9°F | Resp 17 | Ht 73.0 in | Wt 214.4 lb

## 2021-11-29 DIAGNOSIS — N401 Enlarged prostate with lower urinary tract symptoms: Secondary | ICD-10-CM

## 2021-11-29 DIAGNOSIS — E785 Hyperlipidemia, unspecified: Secondary | ICD-10-CM

## 2021-11-29 DIAGNOSIS — Z79899 Other long term (current) drug therapy: Secondary | ICD-10-CM

## 2021-11-29 DIAGNOSIS — I10 Essential (primary) hypertension: Secondary | ICD-10-CM

## 2021-11-29 DIAGNOSIS — Z23 Encounter for immunization: Secondary | ICD-10-CM | POA: Diagnosis not present

## 2021-11-29 DIAGNOSIS — H40001 Preglaucoma, unspecified, right eye: Secondary | ICD-10-CM | POA: Insufficient documentation

## 2021-11-29 DIAGNOSIS — E039 Hypothyroidism, unspecified: Secondary | ICD-10-CM | POA: Diagnosis not present

## 2021-11-29 DIAGNOSIS — R7309 Other abnormal glucose: Secondary | ICD-10-CM

## 2021-11-29 DIAGNOSIS — Z0001 Encounter for general adult medical examination with abnormal findings: Secondary | ICD-10-CM

## 2021-11-29 DIAGNOSIS — E559 Vitamin D deficiency, unspecified: Secondary | ICD-10-CM | POA: Diagnosis not present

## 2021-11-29 DIAGNOSIS — Z86010 Personal history of colonic polyps: Secondary | ICD-10-CM

## 2021-11-29 DIAGNOSIS — E6609 Other obesity due to excess calories: Secondary | ICD-10-CM

## 2021-11-29 DIAGNOSIS — Z1389 Encounter for screening for other disorder: Secondary | ICD-10-CM

## 2021-11-29 DIAGNOSIS — Z136 Encounter for screening for cardiovascular disorders: Secondary | ICD-10-CM

## 2021-11-29 DIAGNOSIS — I700 Atherosclerosis of aorta: Secondary | ICD-10-CM | POA: Diagnosis not present

## 2021-11-29 DIAGNOSIS — R351 Nocturia: Secondary | ICD-10-CM | POA: Diagnosis not present

## 2021-11-29 DIAGNOSIS — Z860101 Personal history of adenomatous and serrated colon polyps: Secondary | ICD-10-CM

## 2021-11-29 NOTE — Patient Instructions (Signed)

## 2021-11-29 NOTE — Progress Notes (Signed)
CPE AND FOLLOW UP  Annual CPE Due Annually  Primary hypertension Above Goal - Situational - Asymptomatic Discussed DASH (Dietary Approaches to Stop Hypertension) DASH diet is lower in sodium than a typical American diet. Cut back on foods that are high in saturated fat, cholesterol, and trans fats. Eat more whole-grain foods, fish, poultry, and nuts Remain active and exercise as tolerated daily.  Monitor BP at home-Call if greater than 130/80.  Check CMP/CBC  Report to ER for any increase in stroke like symptoms, including HA, N/V, paralysis, difficulty speaking, trouble walking, confusion, vision changes, CP, heart palpitations, SOB, diaphoresis.    Hypothyroidism, unspecified type Controlled. Continue Levothyroxine. Reminded to take on an empty stomach 30-54mns before food.  Stop any Biotin Supplement 48-72 hours before next TSH level to reduce the risk of falsely low TSH levels. Continue to monitor.     Hyperlipidemia, unspecified hyperlipidemia type Controlled. Discussed lifestyle modifications. Recommended diet heavy in fruits and veggies, omega 3's. Decrease consumption of animal meats, cheeses, and dairy products. Remain active and exercise as tolerated. Continue to monitor. Check lipids/TSH   Vitamin D deficiency Continue daily supplement Continue to monitor Vitamin D levels  Abnormal glucose Education: Reviewed 'ABCs' of diabetes management  Discussed goals to be met and/or maintained include A1C (<7) Blood pressure (<130/80) Cholesterol (LDL <70) Continue Eye Exam yearly  Continue Dental Exam Q6 mo Discussed dietary recommendations Discussed Physical Activity recommendations Check A1C  Obesity due to excess calories, unspecified classification, unspecified whether serious comorbidity present Discussed appropriate BMI Goal of losing 1 lb per month. Diet modification. Physical activity. Encouraged/praised to build confidence.   Hx of adenomatous  colonic polyps Colonoscopy UTD 02/2023 - 5 year recall Due 2027 Continue colon cancer screening. Continue to monitor.  BPH associated with nocturia Discussed Flomax for symptom control Following Urology Dr. MTresa Moore Continue to monitor.  Right inguinal hernia Discussed [place] referral to General Surgery if s/s fail to improve. Continue to monitor for strangulations, s/s discussed an reviewed including changes in color of skin, growth, uncontrolled pain. Continue to monitor.  Medication management Medications dicussed an reviewed in detail. All questions and concerns addressed.  Need for flu vaccine Administered Patient tolerated welll  Screening for heart disease Check and monitor EKG Check and monitor AAA [place] Screening for protein or blood in urine Check and monitor UA/Microalbumin  Notify office for further evaluation and treatment, questions or concerns if any reported s/s fail to improve.   The patient was advised to call back or seek an in-person evaluation if any symptoms worsen or if the condition fails to improve as anticipated.   Further disposition pending results of labs. Discussed med's effects and SE's.    I discussed the assessment and treatment plan with the patient. The patient was provided an opportunity to ask questions and all were answered. The patient agreed with the plan and demonstrated an understanding of the instructions.  Discussed med's effects and SE's. Screening labs and tests as requested with regular follow-up as recommended.  I provided 35 minutes of face-to-face time during this encounter including counseling, chart review, and critical decision making was preformed.   Future Appointments  Date Time Provider [place] 03/03/2022 10:30 AM CDarrol Jump NP [facility] None  11/30/2022  9:00 AM CDarrol Jump NP [facility] None    Subjective:  Julian ROLFEis a 66y.o. male who presents for Annual CPE and follow up  follow  up. He has Hypertension; Hyperlipidemia; Hypothyroidism; Vitamin D  deficiency; Medication management; Abnormal glucose; Obesity; Hx of adenomatous colonic polyps; BPH associated with nocturia; and Preglaucoma of right eye on their problem list.   Overall he reports feeling well.   Reports having recent ;eft cataract surgery with Dr. Tobe Sos 09/21/21. 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  Was thought to have  also follows for primary angle closure without glaucoma damage, right eye.  Doing well.  He has followed with Dr. Tresa Moore, Urology for Spermatocele of epididymis.   He is concerned for right inguinal pain that has increased over the last three months.  Most notabley when walking. Pain concentrated in the center of the perineum. He is followed by Urology has BPH with nocturia.  Has not had referral to General Surgery for review of hernia.   BMI is Body mass index is 28.29 kg/m., he has not been working on diet and exercise d/t inguinal hernia.  Does not have as much pain as before and plans to start walking after holidays.   Wt Readings from Last 3 Encounters:  11/29/21 214 lb 6.4 oz (97.3 kg)  05/26/21 179 lb 12.8 oz (81.6 kg)  03/24/21 171 lb 6.4 oz (77.7 kg)   His blood pressure has been controlled at home, today their BP is BP: (!) 160/98 Feels as though this is situational.  Has not taken BP medications this AM. Denies CP, heart palpitations, SOB.  He does workout. He denies chest pain, shortness of breath, dizziness.  He is on cholesterol medication and denies myalgias. His cholesterol is at goal. The cholesterol last visit was:   Lab Results  Component Value Date   CHOL 155 05/26/2021   HDL 50 05/26/2021   LDLCALC 85 05/26/2021   TRIG 104 05/26/2021   CHOLHDL 3.1 05/26/2021   He has been working on diet and exercise for prediabetes, and denies nausea, polydipsia, and polyuria. Last A1C in the office was:  Lab Results   Component Value Date   HGBA1C 4.7 11/26/2020   Last GFR Lab Results  Component Value Date   EGFR 76 05/26/2021   Patient is on Vitamin D supplement.   Lab Results  Component Value Date   VD25OH 98 05/26/2021      Medication Review:  Current Outpatient Medications (Endocrine & Metabolic):    levothyroxine (SYNTHROID) 175 MCG tablet, TAKE 1 TABLET DAILY ON AN EMPTY STOMACH WITH ONLY WATER FOR 30 MINUTES AND NO ANTACID MEDICATIONS, CALCIUM, OR MAGNESIUM FOR 4 HOURS AND AVOID BIOTIN  Current Outpatient Medications (Cardiovascular):    doxazosin (CARDURA) 2 MG tablet, Take 1 tablet at Bedtime for BP & Prostate   olmesartan-hydrochlorothiazide (BENICAR HCT) 20-12.5 MG tablet, Take 1 tablet Daily for BP   olmesartan-hydrochlorothiazide (BENICAR HCT) 40-25 MG tablet, Take 1 tablet Daily for BP   Current Outpatient Medications (Analgesics):    BABY ASPIRIN PO, Take 81 mg by mouth daily.   Current Outpatient Medications (Other):    Ascorbic Acid (VITAMIN C) 1000 MG tablet, Take 1,000 mg by mouth daily.   Cholecalciferol (VITAMIN D PO), Take 2,000 Units by mouth 2 times daily at 12 noon and 4 pm.   Flaxseed, Linseed, (FLAX SEED OIL PO), Take by mouth daily. Takes 2 caps daily   Glucosamine HCl (GLUCOSAMINE PO), Take by mouth. 1035m   Magnesium 250 MG TABS, Take by mouth.   Multiple Vitamins-Minerals (MULTIVITAMIN ADULTS 50+ PO), Take by mouth.   triamcinolone cream (KENALOG) 0.5 %, Apply 1 application topically  2 (two) times daily.   zinc gluconate 50 MG tablet, Take 50 mg by mouth daily.   doxycycline (VIBRAMYCIN) 100 MG capsule, Take 1 capsule 2 x /day with meals for Infection (Patient not taking: Reported on 05/26/2021)  Allergies: Allergies  Allergen Reactions   Latex     Current Problems (verified) has Hypertension; Hyperlipidemia; Hypothyroidism; Vitamin D deficiency; Medication management; Abnormal glucose; Obesity; Hx of adenomatous colonic polyps; BPH associated with  nocturia; and Preglaucoma of right eye on their problem list.  Screening Tests Immunization History  Administered Date(s) Administered   Influenza Inj Mdck Quad With Preservative 11/08/2016, 11/07/2017   Influenza Split 09/26/2012   Influenza, High Dose Seasonal PF 11/12/2018, 09/23/2020, 11/29/2021   Influenza-Unspecified 11/07/2016, 11/07/2017   Moderna Covid-19 Vaccine Bivalent Booster 24yr & up 10/12/2020   PFIZER(Purple Top)SARS-COV-2 Vaccination 04/04/2019, 04/29/2019   PPD Test 04/01/2013   Pneumococcal Conjugate-13 11/12/2018   Pneumococcal Polysaccharide-23 09/16/2008   Td 08/22/2006   Tdap 11/08/2016   Zoster Recombinat (Shingrix) 08/04/2020, 10/12/2020   Zoster, Live 01/10/2006   Health Maintenance  Topic Date Due   Medicare Annual Wellness (AWV)  Never done   Pneumonia Vaccine 68 Years old (3 - PPSV23 or PCV20) 11/12/2019   COVID-19 Vaccine (4 - Mixed Product risk series) 12/07/2020   COLONOSCOPY (Pts 45-439yrInsurance coverage will need to be confirmed)  02/24/2025   INFLUENZA VACCINE  Completed   Hepatitis C Screening  Completed   Zoster Vaccines- Shingrix  Completed   HPV VACCINES  Aged Out   Fecal DNA (Cologuard)  Discontinued    Last colonoscopy: 02/2020 Due 2027  Names of Other Physician/Practitioners you currently use: 1. [facility] here for primary care 2. Due, eye doctor, last visit 09/2021  3. Due, dentist, last visit - Dr. LiOleta Mouse6 mo 4. Due, derm, last visit - defers at this time - denies any new or recent skin changes  Patient Care Team: McUnk PintoMD as PCP - General (Internal Medicine) GeGatha MayerMD as Consulting Physician (Gastroenterology) GrCrista LuriaMD as Consulting Physician (Dermatology)  Surgical: He  has a past surgical history that includes Tonsillectomy and adenoidectomy and Colonoscopy (2019). Family His family history includes Cancer in his mother; Heart attack in his father;  Rheum arthritis in his mother. Social history  He reports that he quit smoking about 17 years ago. His smoking use included cigarettes. He quit smokeless tobacco use about 31 years ago. He reports current alcohol use. He reports that he does not use drugs.    Objective:   Today's Vitals   11/29/21 0846 11/29/21 1014  BP: (!) 160/100 (!) 160/98  Pulse: 63   Resp: 17   Temp: 97.9 F (36.6 C)   SpO2: 99%   Weight: 214 lb 6.4 oz (97.3 kg)   Height: _0  (1.854 m)     Body mass index is 28.29 kg/m.  General appearance: alert, no distress, WD/WN, male HEENT: normocephalic, sclerae anicteric, TMs pearly, nares patent, no discharge or erythema, pharynx normal Oral cavity: MMM, no lesions Neck: supple, no lymphadenopathy, no thyromegaly, no masses Heart: RRR, normal S1, S2, no murmurs Lungs: CTA bilaterally, no wheezes, rhonchi, or rales Abdomen: +bs, soft, non tender, non distended, no masses, no hepatomegaly, no splenomegaly Musculoskeletal: nontender, no swelling, no obvious deformity Extremities: no edema, no cyanosis, no clubbing Pulses: 2+ symmetric, upper and lower extremities, normal cap refill Neurological: alert, oriented x 3, CN2-12 intact, strength normal upper extremities and lower  extremities, sensation normal throughout, DTRs 2+ throughout, no cerebellar signs, gait normal Psychiatric: normal affect, behavior normal, pleasant   EKG: NRS AAA [place]: Negative     Jaziyah Gradel, NP   11/29/2021

## 2021-11-30 LAB — CBC WITH DIFFERENTIAL/PLATELET
Absolute Monocytes: 392 cells/uL (ref 200–950)
Basophils Absolute: 72 cells/uL (ref 0–200)
Basophils Relative: 1.8 %
Eosinophils Absolute: 264 cells/uL (ref 15–500)
Eosinophils Relative: 6.6 %
HCT: 48.2 % (ref 38.5–50.0)
Hemoglobin: 16.1 g/dL (ref 13.2–17.1)
Lymphs Abs: 848 cells/uL — ABNORMAL LOW (ref 850–3900)
MCH: 30.8 pg (ref 27.0–33.0)
MCHC: 33.4 g/dL (ref 32.0–36.0)
MCV: 92.3 fL (ref 80.0–100.0)
MPV: 10.6 fL (ref 7.5–12.5)
Monocytes Relative: 9.8 %
Neutro Abs: 2424 cells/uL (ref 1500–7800)
Neutrophils Relative %: 60.6 %
Platelets: 191 10*3/uL (ref 140–400)
RBC: 5.22 10*6/uL (ref 4.20–5.80)
RDW: 12.3 % (ref 11.0–15.0)
Total Lymphocyte: 21.2 %
WBC: 4 10*3/uL (ref 3.8–10.8)

## 2021-11-30 LAB — COMPLETE METABOLIC PANEL WITH GFR
AG Ratio: 1.8 (calc) (ref 1.0–2.5)
ALT: 17 U/L (ref 9–46)
AST: 19 U/L (ref 10–35)
Albumin: 4.5 g/dL (ref 3.6–5.1)
Alkaline phosphatase (APISO): 71 U/L (ref 35–144)
BUN: 20 mg/dL (ref 7–25)
CO2: 32 mmol/L (ref 20–32)
Calcium: 9.9 mg/dL (ref 8.6–10.3)
Chloride: 104 mmol/L (ref 98–110)
Creat: 1.01 mg/dL (ref 0.70–1.35)
Globulin: 2.5 g/dL (calc) (ref 1.9–3.7)
Glucose, Bld: 86 mg/dL (ref 65–99)
Potassium: 4.7 mmol/L (ref 3.5–5.3)
Sodium: 141 mmol/L (ref 135–146)
Total Bilirubin: 0.4 mg/dL (ref 0.2–1.2)
Total Protein: 7 g/dL (ref 6.1–8.1)
eGFR: 81 mL/min/{1.73_m2} (ref >=60)

## 2021-11-30 LAB — LIPID PANEL
Cholesterol: 188 mg/dL (ref <200)
HDL: 58 mg/dL (ref >=40)
LDL Cholesterol (Calc): 111 mg/dL (calc) — ABNORMAL HIGH
Non-HDL Cholesterol (Calc): 130 mg/dL (calc) — ABNORMAL HIGH (ref <130)
Total CHOL/HDL Ratio: 3.2 (calc) (ref <5.0)
Triglycerides: 89 mg/dL (ref <150)

## 2021-11-30 LAB — URINALYSIS, ROUTINE W REFLEX MICROSCOPIC
Bilirubin Urine: NEGATIVE
Glucose, UA: NEGATIVE
Hgb urine dipstick: NEGATIVE
Ketones, ur: NEGATIVE
Leukocytes,Ua: NEGATIVE
Nitrite: NEGATIVE
Protein, ur: NEGATIVE
Specific Gravity, Urine: 1.017 (ref 1.001–1.035)
pH: 6.5 (ref 5.0–8.0)

## 2021-11-30 LAB — MICROALBUMIN / CREATININE URINE RATIO
Creatinine, Urine: 68 mg/dL (ref 20–320)
Microalb Creat Ratio: 38 mcg/mg creat — ABNORMAL HIGH (ref <30)
Microalb, Ur: 2.6 mg/dL

## 2021-11-30 LAB — VITAMIN D 25 HYDROXY (VIT D DEFICIENCY, FRACTURES): Vit D, 25-Hydroxy: 63 ng/mL (ref 30–100)

## 2021-11-30 LAB — HEMOGLOBIN A1C
Hgb A1c MFr Bld: 5.3 % of total Hgb (ref <5.7)
Mean Plasma Glucose: 105 mg/dL
eAG (mmol/L): 5.8 mmol/L

## 2021-11-30 LAB — INSULIN, RANDOM: Insulin: 9.9 u[IU]/mL

## 2021-11-30 LAB — TSH: TSH: 3.06 mIU/L (ref 0.40–4.50)

## 2021-11-30 LAB — PSA: PSA: 0.69 ng/mL (ref <=4.00)

## 2021-12-06 DIAGNOSIS — H25811 Combined forms of age-related cataract, right eye: Secondary | ICD-10-CM | POA: Diagnosis not present

## 2021-12-06 DIAGNOSIS — H269 Unspecified cataract: Secondary | ICD-10-CM | POA: Diagnosis not present

## 2022-01-14 DIAGNOSIS — H5203 Hypermetropia, bilateral: Secondary | ICD-10-CM | POA: Diagnosis not present

## 2022-01-14 DIAGNOSIS — H52209 Unspecified astigmatism, unspecified eye: Secondary | ICD-10-CM | POA: Diagnosis not present

## 2022-03-03 ENCOUNTER — Ambulatory Visit (INDEPENDENT_AMBULATORY_CARE_PROVIDER_SITE_OTHER): Payer: Medicare HMO | Admitting: Nurse Practitioner

## 2022-03-03 ENCOUNTER — Encounter: Payer: Self-pay | Admitting: Nurse Practitioner

## 2022-03-03 VITALS — BP 126/90 | HR 69 | Temp 97.7°F | Ht 73.0 in | Wt 211.4 lb

## 2022-03-03 DIAGNOSIS — H40001 Preglaucoma, unspecified, right eye: Secondary | ICD-10-CM

## 2022-03-03 DIAGNOSIS — Z0000 Encounter for general adult medical examination without abnormal findings: Secondary | ICD-10-CM

## 2022-03-03 DIAGNOSIS — E039 Hypothyroidism, unspecified: Secondary | ICD-10-CM | POA: Diagnosis not present

## 2022-03-03 DIAGNOSIS — Z79899 Other long term (current) drug therapy: Secondary | ICD-10-CM

## 2022-03-03 DIAGNOSIS — N401 Enlarged prostate with lower urinary tract symptoms: Secondary | ICD-10-CM | POA: Diagnosis not present

## 2022-03-03 DIAGNOSIS — E6609 Other obesity due to excess calories: Secondary | ICD-10-CM

## 2022-03-03 DIAGNOSIS — K4090 Unilateral inguinal hernia, without obstruction or gangrene, not specified as recurrent: Secondary | ICD-10-CM

## 2022-03-03 DIAGNOSIS — Z86010 Personal history of colonic polyps: Secondary | ICD-10-CM

## 2022-03-03 DIAGNOSIS — R351 Nocturia: Secondary | ICD-10-CM

## 2022-03-03 DIAGNOSIS — R7309 Other abnormal glucose: Secondary | ICD-10-CM | POA: Diagnosis not present

## 2022-03-03 DIAGNOSIS — R6889 Other general symptoms and signs: Secondary | ICD-10-CM | POA: Diagnosis not present

## 2022-03-03 DIAGNOSIS — Z0001 Encounter for general adult medical examination with abnormal findings: Secondary | ICD-10-CM | POA: Diagnosis not present

## 2022-03-03 DIAGNOSIS — I10 Essential (primary) hypertension: Secondary | ICD-10-CM

## 2022-03-03 DIAGNOSIS — E559 Vitamin D deficiency, unspecified: Secondary | ICD-10-CM | POA: Diagnosis not present

## 2022-03-03 DIAGNOSIS — Z860101 Personal history of adenomatous and serrated colon polyps: Secondary | ICD-10-CM

## 2022-03-03 DIAGNOSIS — E785 Hyperlipidemia, unspecified: Secondary | ICD-10-CM | POA: Diagnosis not present

## 2022-03-03 NOTE — Progress Notes (Signed)
MEDICARE ANNUAL WELLNESS VISIT AND FOLLOW UP  Initial Medicare annual wellness visit Due Annually Health maintenance reviewed.  Primary hypertension Continue Doxazosin, Olmesartan Discussed DASH (Dietary Approaches to Stop Hypertension) DASH diet is lower in sodium than a typical American diet. Cut back on foods that are high in saturated fat, cholesterol, and trans fats. Eat more whole-grain foods, fish, poultry, and nuts Remain active and exercise as tolerated daily.  Monitor BP at home-Call if greater than 130/80.  Check CMP/CBC   Hypothyroidism, unspecified type Controlled. Continue Levothyroxine. Reminded to take on an empty stomach 30-39mns before food.  Stop any Biotin Supplement 48-72 hours before next TSH level to reduce the risk of falsely low TSH levels. Continue to monitor.     Hyperlipidemia, unspecified hyperlipidemia type Controlled. Continue lifestyle modifications. Recommended diet heavy in fruits and veggies, omega 3's. Decrease consumption of animal meats, cheeses, and dairy products. Remain active and exercise as tolerated. Continue to monitor. Check lipids/TSH  Vitamin D deficiency Continue supplement for goal of 60-100 Monitor levels  Abnormal glucose Education: Reviewed 'ABCs' of diabetes management  Discussed goals to be met and/or maintained include A1C (<7) Blood pressure (<130/80) Cholesterol (LDL <70) Continue Eye Exam yearly  Continue Dental Exam Q6 mo Discussed dietary recommendations Discussed Physical Activity recommendations Check A1C  Obesity due to excess calories, unspecified classification, unspecified whether serious comorbidity present Discussed appropriate BMI Goal of losing 1 lb per month. Diet modification. Physical activity. Encouraged/praised to build confidence.  Hx of adenomatous colonic polyps Continue colon cancer screening - UTD Continue to monitor.  BPH associated with nocturia Discussed Flomax for  symptom control  Following Urology.   Continue to monitor.  Right inguinal hernia Improved - general surgery referral pending. Continue to monitor for strangulations, s/s discussed an reviewed including changes in color of skin, growth, uncontrolled pain. Continue to monitor.  Medication management Medications dicussed an reviewed in detail. All questions and concerns addressed.  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  Patient defers lab work today - gest completed every 6 mo.    Review of last blood work stable.   Notify office for further evaluation and treatment, questions or concerns if any reported s/s fail to improve.   The patient was advised to call back or seek an in-person evaluation if any symptoms worsen or if the condition fails to improve as anticipated.   Further disposition pending results of labs. Discussed med's effects and SE's.    I discussed the assessment and treatment plan with the patient. The patient was provided an opportunity to ask questions and all were answered. The patient agreed with the plan and demonstrated an understanding of the instructions.  Discussed med's effects and SE's. Screening labs and tests as requested with regular follow-up as recommended.  I provided 35 minutes of face-to-face time during this encounter including counseling, chart review, and critical decision making was preformed.    Future Appointments  Date Time Provider DBrier 11/30/2022  9:00 AM CDarrol Jump NP [facility] None     Plan:   During the course of the visit the patient was educated and counseled about appropriate screening and preventive services including:   Pneumococcal vaccine  Influenza vaccine Prevnar 13 Td vaccine Screening electrocardiogram Colorectal cancer screening Diabetes screening Glaucoma  screening Nutrition counseling    Subjective:  Julian Lee a 67y.o. male who  presents for Medicare Annual Wellness Visit and 3 month follow up. He has Hypertension; Hyperlipidemia; Hypothyroidism; Vitamin D deficiency; Medication management; Abnormal glucose; Obesity; Hx of adenomatous colonic polyps; BPH associated with nocturia; and Preglaucoma of right eye on their problem list.  Overall he reports feeling well today.  He has no new or additional concerns at this time.  He had a concern for right inguinal pain that most notabley when walking, however, this has now resolved.  He is followed by Urology has BPH with nocturia.  Has not had referral to General Surgery for review of hernia.   BMI is Body mass index is 27.89 kg/m., he has not been working on diet and exercise d/t inguinal pain, however, plans to start walking again.  Wt Readings from Last 3 Encounters:  03/03/22 211 lb 6.4 oz (95.9 kg)  11/29/21 214 lb 6.4 oz (97.3 kg)  05/26/21 179 lb 12.8 oz (81.6 kg)   His blood pressure has been controlled at home, today their BP is BP: (!) 126/90 He does workout. He denies chest pain, shortness of breath, dizziness.   He is on cholesterol medication and denies myalgias. His cholesterol is at goal. The cholesterol last visit was:   Lab Results  Component Value Date   CHOL 188 11/29/2021   HDL 58 11/29/2021   LDLCALC 111 (H) 11/29/2021   TRIG 89 11/29/2021   CHOLHDL 3.2 11/29/2021   He has been working on diet and exercise for prediabetes, and denies nausea, polydipsia, and polyuria. Last A1C in the office was:  Lab Results  Component Value Date   HGBA1C 5.3 11/29/2021   Last GFR Lab Results  Component Value Date   EGFR 81 11/29/2021   Patient is on Vitamin D supplement.   Lab Results  Component Value Date   VD25OH 63 11/29/2021      Medication Review:  Current Outpatient Medications (Endocrine & Metabolic):    levothyroxine (SYNTHROID) 175 MCG tablet,  TAKE 1 TABLET DAILY ON AN EMPTY STOMACH WITH ONLY WATER FOR 30 MINUTES AND NO ANTACID MEDICATIONS, CALCIUM, OR MAGNESIUM FOR 4 HOURS AND AVOID BIOTIN  Current Outpatient Medications (Cardiovascular):    doxazosin (CARDURA) 2 MG tablet, Take 1 tablet at Bedtime for BP & Prostate   olmesartan-hydrochlorothiazide (BENICAR HCT) 20-12.5 MG tablet, Take 1 tablet Daily for BP   olmesartan-hydrochlorothiazide (BENICAR HCT) 40-25 MG tablet, Take 1 tablet Daily for BP   Current Outpatient Medications (Analgesics):    BABY ASPIRIN PO, Take 81 mg by mouth daily.   Current Outpatient Medications (Other):    Ascorbic Acid (VITAMIN C) 1000 MG tablet, Take 1,000 mg by mouth daily.   Cholecalciferol (VITAMIN D PO), Take 2,000 Units by mouth 2 times daily at 12 noon and 4 pm.   Flaxseed, Linseed, (FLAX SEED OIL PO), Take by mouth daily. Takes 2 caps daily   Glucosamine HCl (GLUCOSAMINE PO), Take by mouth. 1065m   Magnesium 250 MG TABS, Take by mouth.   Multiple Vitamins-Minerals (MULTIVITAMIN ADULTS 50+ PO), Take by mouth.   triamcinolone cream (KENALOG) 0.5 %, Apply 1 application topically 2 (two) times daily.   zinc gluconate 50 MG tablet, Take 50 mg by mouth daily.   doxycycline (VIBRAMYCIN) 100 MG capsule, Take 1 capsule 2 x /day with meals for Infection  Allergies: Allergies  Allergen Reactions   Latex     Current Problems (verified) has Hypertension; Hyperlipidemia; Hypothyroidism; Vitamin D deficiency; Medication management; Abnormal glucose; Obesity; Hx of adenomatous colonic polyps; BPH associated  with nocturia; and Preglaucoma of right eye on their problem list.  Screening Tests Immunization History  Administered Date(s) Administered   Influenza Inj Mdck Quad With Preservative 11/08/2016, 11/07/2017   Influenza Split 09/26/2012   Influenza, High Dose Seasonal PF 11/12/2018, 09/23/2020, 11/29/2021   Influenza-Unspecified 11/07/2016, 11/07/2017   Moderna Covid-19 Vaccine Bivalent  Booster 72yr & up 10/12/2020   PFIZER(Purple Top)SARS-COV-2 Vaccination 04/04/2019, 04/29/2019   PPD Test 04/01/2013   Pneumococcal Conjugate-13 11/12/2018   Pneumococcal Polysaccharide-23 09/16/2008   Td 08/22/2006   Tdap 11/08/2016   Zoster Recombinat (Shingrix) 08/04/2020, 10/12/2020   Zoster, Live 01/10/2006   Health Maintenance  Topic Date Due   COVID-19 Vaccine (4 - 2023-24 season) 09/10/2021   Medicare Annual Wellness (AWV)  05/27/2022   Pneumonia Vaccine 69 Years old (3 of 3 - PPSV23 or PCV20) 11/12/2023   COLONOSCOPY (Pts 45-434yrInsurance coverage will need to be confirmed)  02/24/2025   DTaP/Tdap/Td (3 - Td or Tdap) 11/09/2026   INFLUENZA VACCINE  Completed   Hepatitis C Screening  Completed   Zoster Vaccines- Shingrix  Completed   HPV VACCINES  Aged Out   Fecal DNA (Cologuard)  Discontinued    Last colonoscopy: 02/2020   Names of Other Physician/Practitioners you currently use: 1. [facility] for primary care [facility]. [facility] - last visit [facility]/2021 3. [place] - dentist, 02/2022. 4. Due, derm  Patient Care Team: McUnk PintoMD as PCP - General (Internal Medicine) GeGatha MayerMD as Consulting Physician (Gastroenterology) GrCrista LuriaMD as Consulting Physician (Dermatology)  Surgical: He  has a past surgical history that includes Tonsillectomy and adenoidectomy and Colonoscopy (2019). Family His family history includes Cancer in his mother; Heart attack in his father; Rheum arthritis in his mother. Social history  He reports that he quit smoking about 17 years ago. His smoking use included cigarettes. He quit smokeless tobacco use about 32 years ago. He reports current alcohol use. He reports that he does not use drugs.  MEDICARE WELLNESS OBJECTIVES: Physical activity:   Cardiac risk factors:   Depression/mood screen:      03/03/2022   11:27 AM  Depression screen PHQ 2/9  Decreased  Interest 0  Down, Depressed, Hopeless 0  PHQ - 2 Score 0    ADLs:     03/03/2022   11:22 AM 05/26/2021    9:52 AM  In your present state of health, do you have any difficulty performing the following activities:  Hearing? 0 0  Vision? 0 0  Difficulty concentrating or making decisions?  0  Walking or climbing stairs? 0 0  Dressing or bathing? 0 0  Doing errands, shopping? 0 0  Preparing Food and eating ? N N  Using the Toilet? N N  In the past six months, have you accidently leaked urine? N N  Do you have problems with loss of bowel control? N N  Managing your Medications? N N  Managing your Finances? N N  Housekeeping or managing your Housekeeping? N N     Cognitive Testing  Alert? Yes  Normal Appearance?Yes  Oriented to person? Yes  Place? Yes   Time? Yes  Recall of three objects?  Yes  Can perform simple calculations? Yes  Displays appropriate judgment?Yes  Can read the correct time from a watch face?Yes  EOL planning: Does Patient Have a Medical Advance Directive?: No   Objective:   Today's Vitals   03/03/22 1029  BP: (!) 126/90  Pulse: 69  Temp: 97.7 F (36.5 C)  SpO2: 99%  Weight: 211 lb 6.4 oz (95.9 kg)  Height: 6' 1"$  (1.854 m)   Body mass index is 27.89 kg/m.  General appearance: alert, no distress, WD/WN, male HEENT: normocephalic, sclerae anicteric, TMs pearly, nares patent, no discharge or erythema, pharynx normal Oral cavity: MMM, no lesions Neck: supple, no lymphadenopathy, no thyromegaly, no masses Heart: RRR, normal S1, S2, no murmurs Lungs: CTA bilaterally, no wheezes, rhonchi, or rales Abdomen: +bs, soft, non tender, non distended, no masses, no hepatomegaly, no splenomegaly Musculoskeletal: nontender, no swelling, no obvious deformity Extremities: no edema, no cyanosis, no clubbing Pulses: 2+ symmetric, upper and lower extremities, normal cap refill Neurological: alert, oriented x 3, CN2-12 intact, strength normal upper extremities and  lower extremities, sensation normal throughout, DTRs 2+ throughout, no cerebellar signs, gait normal Psychiatric: normal affect, behavior normal, pleasant   EKG: Due Annual Physical AAA [place]: Due Annual Physical   Medicare Attestation I have personally reviewed: The patient's medical and social history Their use of alcohol, tobacco or illicit drugs Their current medications and supplements The patient's functional ability including ADLs,fall risks, home safety risks, cognitive, and hearing and visual impairment Diet and physical activities Evidence for depression or mood disorders  The patient's weight, height, BMI, and visual acuity have been recorded in the chart.  I have made referrals, counseling, and provided education to the patient based on review of the above and I have provided the patient with a written personalized care plan for preventive services.     Darrol Jump, NP   03/03/2022

## 2022-03-03 NOTE — Patient Instructions (Signed)

## 2022-06-01 ENCOUNTER — Encounter: Payer: Self-pay | Admitting: Internal Medicine

## 2022-06-01 NOTE — Patient Instructions (Signed)

## 2022-06-01 NOTE — Progress Notes (Unsigned)
Future Appointments  Date Time Provider Department  03/03/2022                         had wellness   Adela Glimpse, NP    06/02/2022                          3 mo    ov 11:30 AM Lucky Cowboy, MD [facility]  11/30/2022                          cpe  9:00 AM Adela Glimpse, NP [facility]    History of Present Illness:       This very nice 67 y.o. MWM  with HTN, HLD, Hypothyroidism,  Vit D Deficiency presents for 3 month follow up .         Patient is treated for HTN circa 1990's  & BP has been controlled at home. Today's BP is at goal -  122/88. Patient has had no complaints of any cardiac type chest pain, palpitations, dyspnea / orthopnea / PND, dizziness, claudication, or dependent edema.        Hyperlipidemia is controlled with diet & meds. Patient denies myalgias or other med SE's. Last Lipids were  Lab Results  Component Value Date   CHOL 188 11/29/2021   HDL 58 11/29/2021   LDLCALC 111 (H) 11/29/2021   TRIG 89 11/29/2021   CHOLHDL 3.2 11/29/2021     Also, the patient has history of  morbid Obesity (BMI 37+) and is monitored expectantly for glucose intolerance  and has had no symptoms of reactive hypoglycemia, diabetic polys, paresthesias or visual blurring.  Last A1c was normal & at goal :  Lab Results  Component Value Date   HGBA1C 5.3 11/29/2021   Wt Readings from Last 3 Encounters:  06/02/22 222 lb 6.4 oz (100.9 kg)  03/03/22 211 lb 6.4 oz (95.9 kg)  11/29/21 214 lb 6.4 oz (97.3 kg)                                                          Also, patient is on thyroid replacement since RAI -131 tx for Graves' Dz in 1990. Further, the patient also has history of Vitamin D Deficiency and supplements vitamin D . Last vitamin D was  Lab Results  Component Value Date   VD25OH 63 11/29/2021     Current Outpatient Medications on File Prior to Visit  Medication Sig   Ascorbic Acid (VITAMIN C) 1000 MG tablet Take daily.   BABY ASPIRIN PO Take    daily.    Cholecalciferol (VITAMIN D PO) Take 2,000 Units 2 times daily    doxazosin (CARDURA) 2 MG tablet Take 1 tablet at Bedtime   FLAX SEED OIL  Takes 2 caps daily   Glucosamine 1000mg  Take by mouth.    levothyroxine 175 MCG tablet TAKE 1 TABLET DAILY    Magnesium 250 MG TABS Take daily.   Multiple Vitamins-Minerals  Take daily   olmesartan-hctz 20-12.5 MG tablet Take 1 tablet Daily for BP   triamcinolone cream 0.5 % Apply2 times daily.   zinc 50 MG tablet Take daily.     Allergies  Allergen Reactions  Latex      PMHx:   Past Medical History:  Diagnosis Date   Allergy    Graves disease    pt. had radiation and is now on Thyroid medication    Hx of adenomatous colonic polyps 02/16/2017   Hyperlipidemia    not on meds  only LDL in the past   Hypertension    Thyroid disease    Vitamin D deficiency      Immunization History  Administered Date(s) Administered   Influenza Inj Mdck Quad With Preservative 11/08/2016, 11/07/2017   Influenza Split 09/26/2012   Influenza, High Dose Seasonal PF 11/12/2018, 09/23/2020, 11/29/2021   Influenza-Unspecified 11/07/2016, 11/07/2017   Moderna Covid-19 Vaccine Bivalent Booster 85yrs & up 10/12/2020   PFIZER(Purple Top)SARS-COV-2 Vaccination 04/04/2019, 04/29/2019   PPD Test 04/01/2013   Pneumococcal Conjugate-13 11/12/2018   Pneumococcal Polysaccharide-23 09/16/2008   Td 08/22/2006   Tdap 11/08/2016   Zoster Recombinat (Shingrix) 08/04/2020, 10/12/2020   Zoster, Live 01/10/2006     Past Surgical History:  Procedure Laterality Date   COLONOSCOPY  2019   TONSILLECTOMY AND ADENOIDECTOMY       FHx:    Reviewed / unchanged   SHx:    Reviewed / unchanged    Systems Review:  Constitutional: Denies fever, chills, wt changes, headaches, insomnia, fatigue, night sweats, change in appetite. Eyes: Denies redness, blurred vision, diplopia, discharge, itchy, watery eyes.  ENT: Denies discharge, congestion, post nasal drip, epistaxis, sore  throat, earache, hearing loss, dental pain, tinnitus, vertigo, sinus pain, snoring.  CV: Denies chest pain, palpitations, irregular heartbeat, syncope, dyspnea, diaphoresis, orthopnea, PND, claudication or edema. Respiratory: denies cough, dyspnea, DOE, pleurisy, hoarseness, laryngitis, wheezing.  Gastrointestinal: Denies dysphagia, odynophagia, heartburn, reflux, water brash, abdominal pain or cramps, nausea, vomiting, bloating, diarrhea, constipation, hematemesis, melena, hematochezia  or hemorrhoids. Genitourinary: Denies dysuria, frequency, urgency, nocturia, hesitancy, discharge, hematuria or flank pain. Musculoskeletal: Denies arthralgias, myalgias, stiffness, jt. swelling, pain, limping or strain/sprain.  Skin: Denies pruritus, rash, hives, warts, acne, eczema or change in skin lesion(s). Neuro: No weakness, tremor, incoordination, spasms, paresthesia or pain. Psychiatric: Denies confusion, memory loss or sensory loss. Endo: Denies change in weight, skin or hair change.  Heme/Lymph: No excessive bleeding, bruising or enlarged lymph nodes.   Physical Exam  BP 122/88   Pulse 86   Temp 97.7 F (36.5 C)   Resp 16   Ht 6\' 1"  (1.854 m)   Wt 222 lb 6.4 oz (100.9 kg)   SpO2 97%   BMI 29.34 kg/m   Appears  well nourished, well groomed  and in no distress.  Eyes: PERRLA, EOMs, conjunctiva no swelling or erythema. Sinuses: No frontal/maxillary tenderness ENT/Mouth: EAC's clear, TM's nl w/o erythema, bulging. Nares clear w/o erythema, swelling, exudates. Oropharynx clear without erythema or exudates. Oral hygiene is good. Tongue normal, non obstructing. Hearing intact.  Neck: Supple. Thyroid not palpable. Car 2+/2+ without bruits, nodes or JVD. Chest: Respirations nl with BS clear & equal w/o rales, rhonchi, wheezing or stridor.  Cor: Heart sounds normal w/ regular rate and rhythm without sig. murmurs, gallops, clicks or rubs. Peripheral pulses normal and equal  without edema.  Abdomen:  Soft & bowel sounds normal. Non-tender w/o guarding, rebound, hernias, masses or organomegaly.  Lymphatics: Unremarkable.  Musculoskeletal: Full ROM all peripheral extremities, joint stability, 5/5 strength and normal gait.  Skin: Warm, dry without exposed rashes, lesions or ecchymosis apparent.  Neuro: Cranial nerves intact, reflexes equal bilaterally. Sensory-motor testing grossly intact. Tendon reflexes grossly intact.  Pysch: Alert & oriented x 3.  Insight and judgement nl & appropriate. No ideations.   Assessment and Plan:   1. Essential hypertension  - Continue medication, monitor blood pressure at home.  - Continue DASH diet.  Reminder to go to the ER if any CP,  SOB, nausea, dizziness, severe HA, changes vision/speech.    - CBC with Differential/Platelet - COMPLETE METABOLIC PANEL WITH GFR - Magnesium - TSH   2. Hyperlipidemia, mixed  - Continue diet/meds, exercise,& lifestyle modifications.  - Continue monitor periodic cholesterol/liver & renal functions     - Lipid panel - TSH   3. Abnormal glucose  - Continue diet, exercise  - Lifestyle modifications.  - Monitor appropriate labs   - Hemoglobin A1c - Insulin, random   4. Vitamin D deficiency  - Continue supplementation   - VITAMIN D 25 Hydroxy    5. Hypothyroidism  - TSH   6. Medication management  - CBC with Differential/Platelet - COMPLETE METABOLIC PANEL WITH GFR - Magnesium - Lipid panel - TSH - Hemoglobin A1c - Insulin, random - VITAMIN D 25 Hydroxy          Discussed  regular exercise, BP monitoring, weight control to achieve/maintain BMI less than 25 and discussed med and SE's. Recommended labs to assess /monitor clinical status .  I discussed the assessment and treatment plan with the patient. The patient was provided an opportunity to ask questions and all were answered. The patient agreed with the plan and demonstrated an understanding of the instructions.  I provided over 30  minutes of exam, counseling, chart review and  complex critical decision making.        The patient was advised to call back or seek an in-person evaluation if the symptoms worsen or if the condition fails to improve as anticipated.   Marinus Maw, MD

## 2022-06-02 ENCOUNTER — Ambulatory Visit (INDEPENDENT_AMBULATORY_CARE_PROVIDER_SITE_OTHER): Payer: Medicare HMO | Admitting: Internal Medicine

## 2022-06-02 ENCOUNTER — Encounter: Payer: Self-pay | Admitting: Internal Medicine

## 2022-06-02 VITALS — BP 122/88 | HR 86 | Temp 97.7°F | Resp 16 | Ht 73.0 in | Wt 222.4 lb

## 2022-06-02 DIAGNOSIS — E039 Hypothyroidism, unspecified: Secondary | ICD-10-CM | POA: Diagnosis not present

## 2022-06-02 DIAGNOSIS — R7309 Other abnormal glucose: Secondary | ICD-10-CM

## 2022-06-02 DIAGNOSIS — Z79899 Other long term (current) drug therapy: Secondary | ICD-10-CM

## 2022-06-02 DIAGNOSIS — E559 Vitamin D deficiency, unspecified: Secondary | ICD-10-CM | POA: Diagnosis not present

## 2022-06-02 DIAGNOSIS — E782 Mixed hyperlipidemia: Secondary | ICD-10-CM | POA: Diagnosis not present

## 2022-06-02 DIAGNOSIS — I10 Essential (primary) hypertension: Secondary | ICD-10-CM

## 2022-06-03 LAB — LIPID PANEL
Cholesterol: 192 mg/dL (ref <200)
HDL: 56 mg/dL (ref >=40)
LDL Cholesterol (Calc): 112 mg/dL (calc) — ABNORMAL HIGH
Non-HDL Cholesterol (Calc): 136 mg/dL (calc) — ABNORMAL HIGH (ref <130)
Total CHOL/HDL Ratio: 3.4 (calc) (ref <5.0)
Triglycerides: 129 mg/dL (ref <150)

## 2022-06-03 LAB — COMPLETE METABOLIC PANEL WITH GFR
AG Ratio: 1.6 (calc) (ref 1.0–2.5)
ALT: 25 U/L (ref 9–46)
AST: 24 U/L (ref 10–35)
Albumin: 4.5 g/dL (ref 3.6–5.1)
Alkaline phosphatase (APISO): 72 U/L (ref 35–144)
BUN: 14 mg/dL (ref 7–25)
CO2: 30 mmol/L (ref 20–32)
Calcium: 10.3 mg/dL (ref 8.6–10.3)
Chloride: 100 mmol/L (ref 98–110)
Creat: 1.04 mg/dL (ref 0.70–1.35)
Globulin: 2.8 g/dL (calc) (ref 1.9–3.7)
Glucose, Bld: 95 mg/dL (ref 65–99)
Potassium: 4.2 mmol/L (ref 3.5–5.3)
Sodium: 138 mmol/L (ref 135–146)
Total Bilirubin: 0.6 mg/dL (ref 0.2–1.2)
Total Protein: 7.3 g/dL (ref 6.1–8.1)
eGFR: 78 mL/min/{1.73_m2} (ref >=60)

## 2022-06-03 LAB — CBC WITH DIFFERENTIAL/PLATELET
Absolute Monocytes: 535 cells/uL (ref 200–950)
Basophils Absolute: 81 cells/uL (ref 0–200)
Basophils Relative: 1.5 %
Eosinophils Absolute: 108 cells/uL (ref 15–500)
Eosinophils Relative: 2 %
HCT: 51.7 % — ABNORMAL HIGH (ref 38.5–50.0)
Hemoglobin: 17.2 g/dL — ABNORMAL HIGH (ref 13.2–17.1)
Lymphs Abs: 1075 cells/uL (ref 850–3900)
MCH: 30.4 pg (ref 27.0–33.0)
MCHC: 33.3 g/dL (ref 32.0–36.0)
MCV: 91.5 fL (ref 80.0–100.0)
MPV: 10.9 fL (ref 7.5–12.5)
Monocytes Relative: 9.9 %
Neutro Abs: 3602 cells/uL (ref 1500–7800)
Neutrophils Relative %: 66.7 %
Platelets: 209 10*3/uL (ref 140–400)
RBC: 5.65 10*6/uL (ref 4.20–5.80)
RDW: 12.5 % (ref 11.0–15.0)
Total Lymphocyte: 19.9 %
WBC: 5.4 10*3/uL (ref 3.8–10.8)

## 2022-06-03 LAB — MAGNESIUM: Magnesium: 2 mg/dL (ref 1.5–2.5)

## 2022-06-03 LAB — TSH: TSH: 0.84 mIU/L (ref 0.40–4.50)

## 2022-06-03 LAB — HEMOGLOBIN A1C
Hgb A1c MFr Bld: 5.3 % of total Hgb (ref <5.7)
Mean Plasma Glucose: 105 mg/dL
eAG (mmol/L): 5.8 mmol/L

## 2022-06-03 LAB — INSULIN, RANDOM: Insulin: 15.8 u[IU]/mL

## 2022-06-03 LAB — VITAMIN D 25 HYDROXY (VIT D DEFICIENCY, FRACTURES): Vit D, 25-Hydroxy: 69 ng/mL (ref 30–100)

## 2022-06-04 NOTE — Progress Notes (Signed)
^<^<^<^<^<^<^<^<^<^<^<^<^<^<^<^<^<^<^<^<^<^<^<^<^<^<^<^<^<^<^<^<^<^<^<^<^ ^>^>^>^>^>^>^>^>^>^>^>>^>^>^>^>^>^>^>^>^>^>^>^>^>^>^>^>^>^>^>^>^>^>^>^>^>  -Test results slightly outside the reference range are not unusual. If there is anything important, I will review this with you,  otherwise it is considered normal test values.  If you have further questions,  please do not hesitate to contact me at the office or via My Chart.   ^<^<^<^<^<^<^<^<^<^<^<^<^<^<^<^<^<^<^<^<^<^<^<^<^<^<^<^<^<^<^<^<^<^<^<^<^ ^>^>^>^>^>^>^>^>^>^>^>^>^>^>^>^>^>^>^>^>^>^>^>^>^>^>^>^>^>^>^>^>^>^>^>^>^  -  Total  Chol =   192      - Elevated             (  Ideal  or  Goal is less than 180  !  )  & -  Bad / Dangerous LDL  Chol = 112   - also Elevated              (  Ideal  or  Goal is less than 70  !  )   - Cholesterol is too high - Recommend  a stricter low cholesterol diet                                                      So won't have to start meds for Cholesterol    - Cholesterol only comes from animal sources                                                                              - ie. meat, dairy, egg yolks  - Eat all the vegetables you want.  - Avoid Meat, Avoid Meat,  Avoid Meat                                                           - especially Red Meat - Beef AND Pork .  - Avoid cheese & dairy - milk & ice cream.     - Cheese is the most concentrated form of trans-fats which                                                            is the worst thing to clog up our arteries.   - Veggie cheese is OK which can be found in the fresh                          produce section at [employer] or [employer] or [employer]  ^<^<^<^<^<^<^<^<^<^<^<^<^<^<^<^<^<^<^<^<^<^<^<^<^<^<^<^<^<^<^<^<^<^<^<^<^ ^>^>^>^>^>^>^>^>^>^>^>^>^>^>^>^>^>^>^>^>^>^>^>^>^>^>^>^>^>^>^>^>^>^>^>^>^  -  A1c - Normal   -    No  Diabetes  - Great    !    ^<^<^<^<^<^<^<^<^<^<^<^<^<^<^<^<^<^<^<^<^<^<^<^<^<^<^<^<^<^<^<^<^<^<^<^<^ ^>^>^>^>^>^>^>^>^>^>^>^>^>^>^>^>^>^>^>^>^>^>^>^>^>^>^>^>^>^>^>^>^>^>^>^>^   Vitamin D = 69 - Great  - Please continue dose same   ^<^<^<^<^<^<^<^<^<^<^<^<^<^<^<^<^<^<^<^<^<^<^<^<^<^<^<^<^<^<^<^<^<^<^<^<^ ^>^>^>^>^>^>^>^>^>^>^>^>^>^>^>^>^>^>^>^>^>^>^>^>^>^>^>^>^>^>^>^>^>^>^>^>^  - All Else - CBC - Kidneys - Electrolytes - Liver -  Magnesium & Thyroid    - all  Normal / OK  ^<^<^<^<^<^<^<^<^<^<^<^<^<^<^<^<^<^<^<^<^<^<^<^<^<^<^<^<^<^<^<^<^<^<^<^<^ ^>^>^>^>^>^>^>^>^>^>^>^>^>^>^>^>^>^>^>^>^>^>^>^>^>^>^>^>^>^>^>^>^>^>^>^>^

## 2022-06-17 DIAGNOSIS — H40061 Primary angle closure without glaucoma damage, right eye: Secondary | ICD-10-CM | POA: Diagnosis not present

## 2022-07-05 ENCOUNTER — Other Ambulatory Visit: Payer: Self-pay

## 2022-07-05 MED ORDER — OLMESARTAN MEDOXOMIL-HCTZ 20-12.5 MG PO TABS: ORAL_TABLET | ORAL | 3 refills | Status: AC

## 2022-07-25 ENCOUNTER — Other Ambulatory Visit: Payer: Self-pay

## 2022-07-25 DIAGNOSIS — I10 Essential (primary) hypertension: Secondary | ICD-10-CM

## 2022-07-25 MED ORDER — DOXAZOSIN MESYLATE 2 MG PO TABS: ORAL_TABLET | ORAL | 3 refills | Status: AC

## 2022-08-15 ENCOUNTER — Other Ambulatory Visit: Payer: Self-pay

## 2022-08-15 DIAGNOSIS — E039 Hypothyroidism, unspecified: Secondary | ICD-10-CM

## 2022-08-15 MED ORDER — LEVOTHYROXINE SODIUM 175 MCG PO TABS: ORAL_TABLET | ORAL | 3 refills | Status: AC

## 2022-09-07 IMAGING — CT CT HEAD W/O CM
4 series · 16 of 47 positions shown, 18 images · non-contrast
Comparison: None.

CLINICAL DATA: Syncopal episode after head trauma.



[Series 3: head without · axial · non-contrast · 0.48mm/px · z∈[+1469,+1594]mm · 7 of 35 slices shown, 9 images]
[im 5/35  brain]
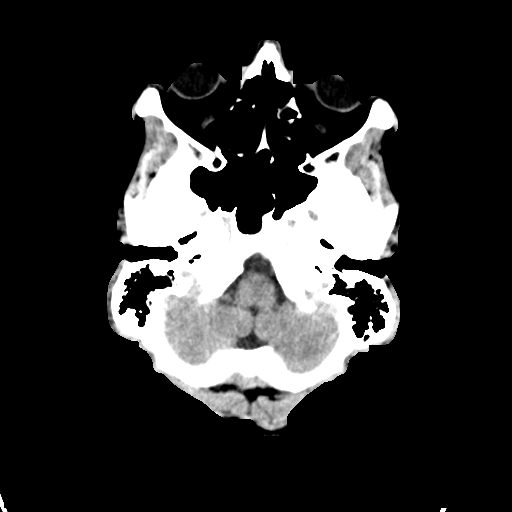
[im 5/35  bone]
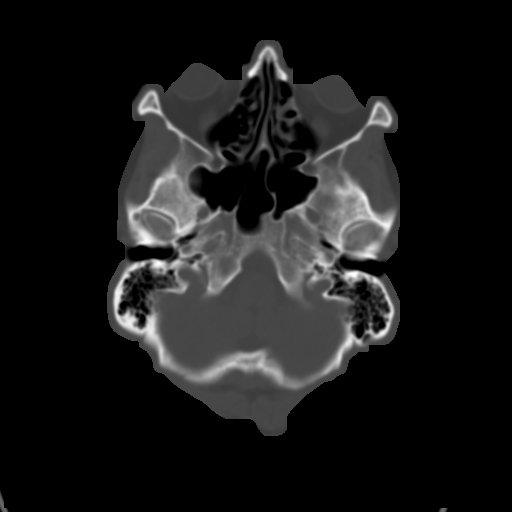
[im 9/35  brain]
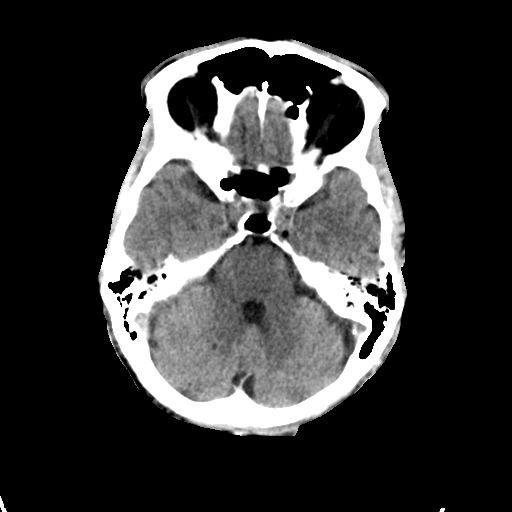
[im 13/35  brain]
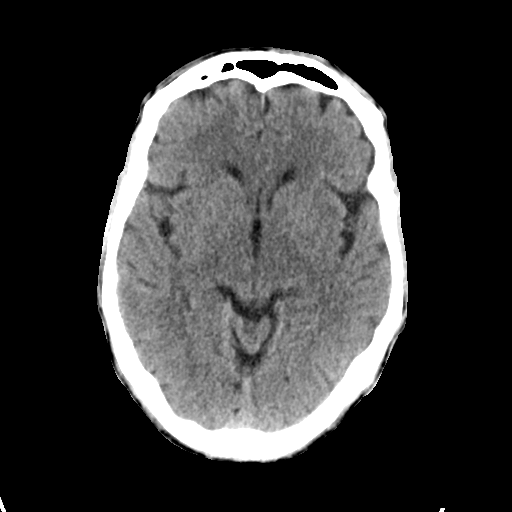
[im 18/35  brain]
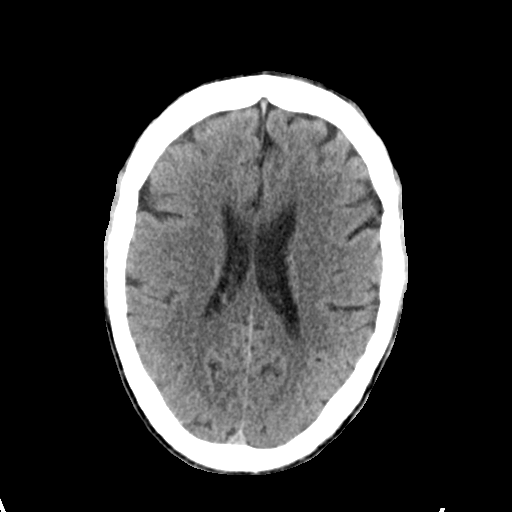
[im 22/35  brain]
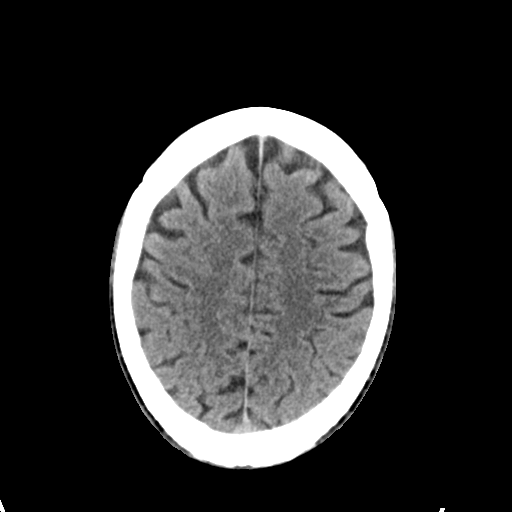
[im 22/35  bone]
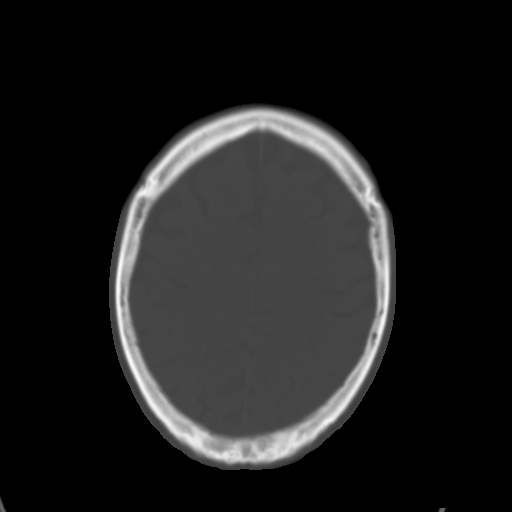
[im 26/35  brain]
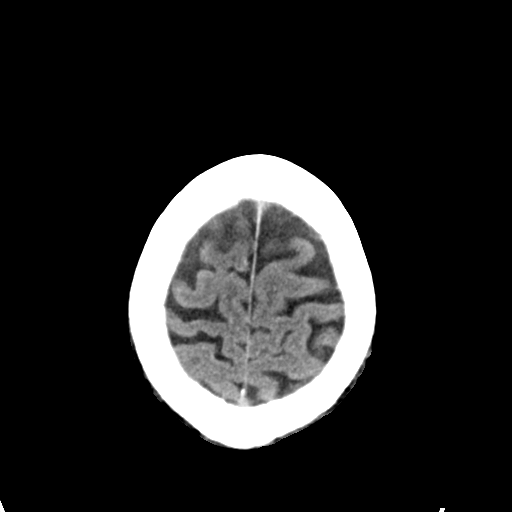
[im 30/35  brain]
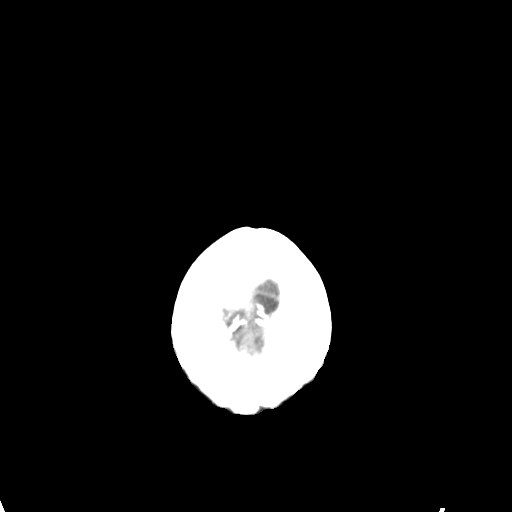

[Series 4: head bone · axial · 0.48mm/px · z∈[+1465,+1499]mm · 3 of 86 slices shown]
[im 9/86  bone]
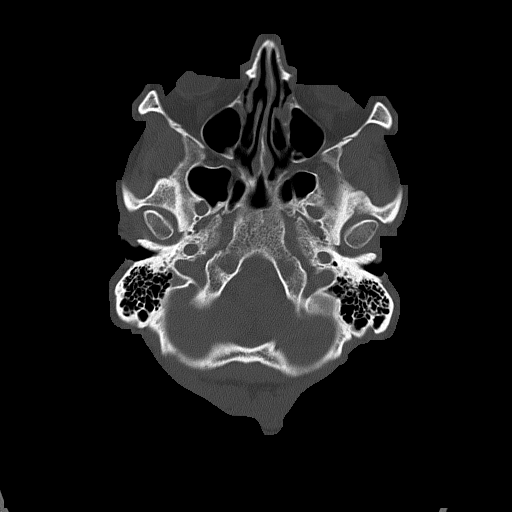
[im 18/86  bone]
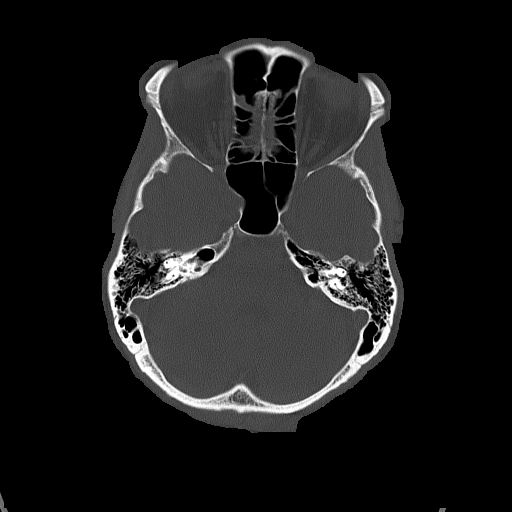
[im 26/86  bone]
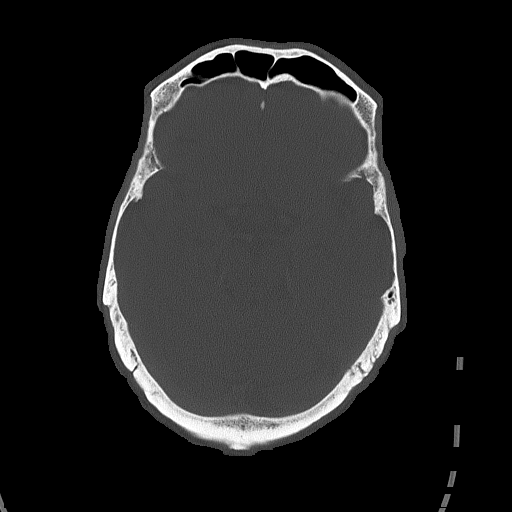

[Series 5: head without cor · coronal · non-contrast · 0.34mm/px · 3 of 76 slices shown]
[im 26/76  brain]
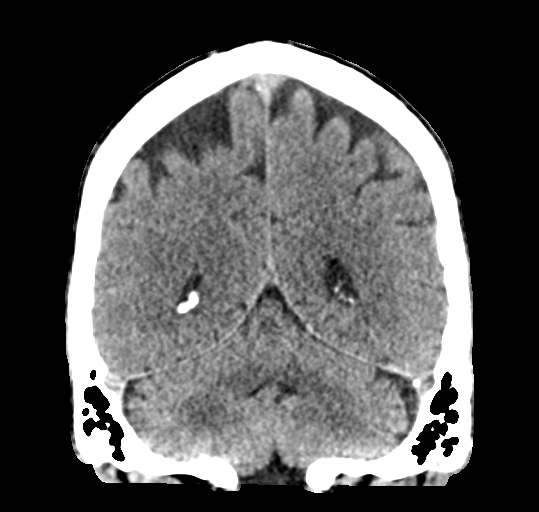
[im 34/76  brain]
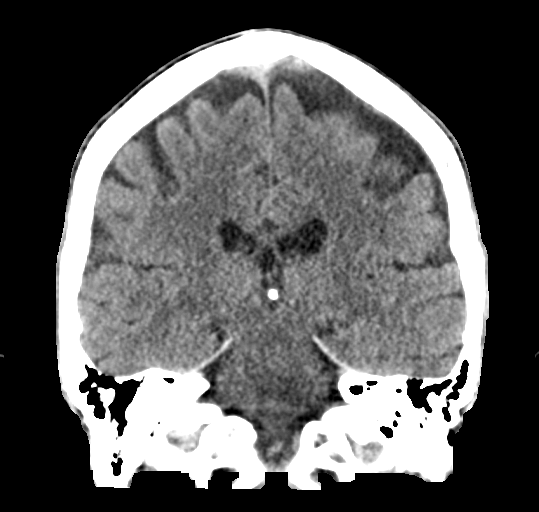
[im 42/76  brain]
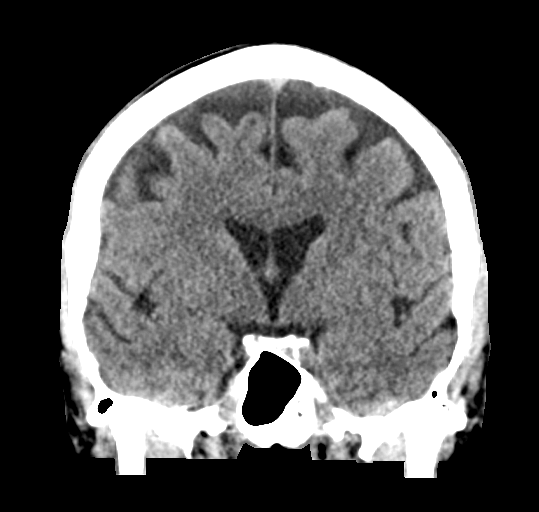

[Series 6: head without sag · sagittal · non-contrast · 0.35mm/px · 3 of 59 slices shown]
[im 20/59  brain]
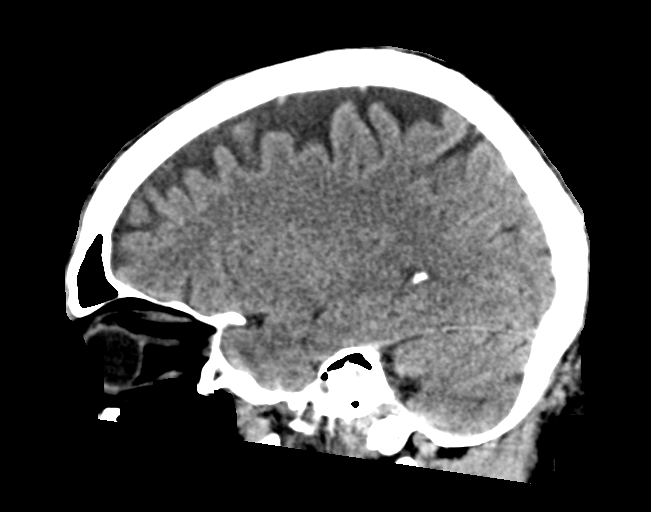
[im 30/59  brain]
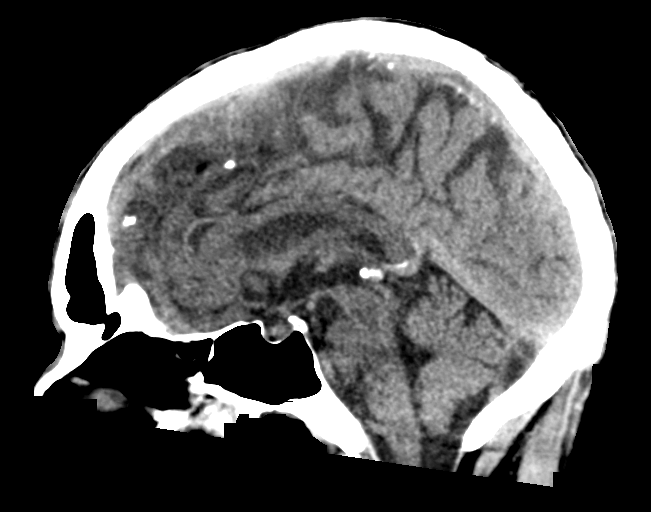
[im 39/59  brain]
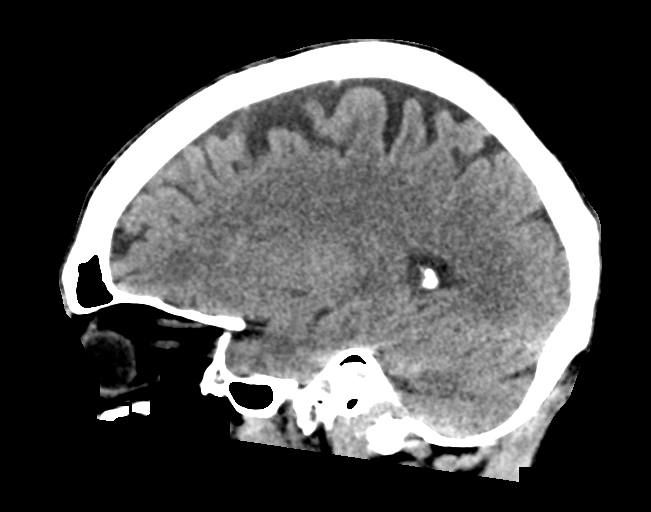

[16 of 47 positions shown; findings below may reference images not displayed]

FINDINGS: Brain: No hydrocephalus. No mass, hemorrhage, edema or other
evidence of acute parenchymal abnormality. No extra-axial
hemorrhage.

Vascular: No hyperdense vessel or unexpected calcification.

Skull: Normal. Negative for fracture or focal lesion.

Sinuses/Orbits: Mild mucosal thickening within the ethmoid air cells
and maxillary sinuses. Sinuses otherwise clear. Periorbital and
retro-orbital soft tissues are unremarkable.

Other: Focal scalp edema/hematoma overlying the LEFT frontal bone.
No underlying fracture.
IMPRESSION: 1. Focal scalp edema/hematoma overlying the LEFT frontal bone. No
underlying fracture.
2. No acute intracranial abnormality. No intracranial mass,
hemorrhage or edema.

## 2022-11-30 ENCOUNTER — Encounter: Payer: Self-pay | Admitting: Nurse Practitioner

## 2022-11-30 ENCOUNTER — Ambulatory Visit (INDEPENDENT_AMBULATORY_CARE_PROVIDER_SITE_OTHER): Payer: Medicare HMO | Admitting: Nurse Practitioner

## 2022-11-30 VITALS — BP 122/90 | HR 71 | Temp 98.2°F | Ht 73.0 in | Wt 242.2 lb

## 2022-11-30 DIAGNOSIS — K4090 Unilateral inguinal hernia, without obstruction or gangrene, not specified as recurrent: Secondary | ICD-10-CM

## 2022-11-30 DIAGNOSIS — E559 Vitamin D deficiency, unspecified: Secondary | ICD-10-CM | POA: Diagnosis not present

## 2022-11-30 DIAGNOSIS — Z130 Encounter for screening for diseases of the blood and blood-forming organs and certain disorders involving the immune mechanism: Secondary | ICD-10-CM | POA: Diagnosis not present

## 2022-11-30 DIAGNOSIS — Z1389 Encounter for screening for other disorder: Secondary | ICD-10-CM | POA: Diagnosis not present

## 2022-11-30 DIAGNOSIS — Z0001 Encounter for general adult medical examination with abnormal findings: Secondary | ICD-10-CM

## 2022-11-30 DIAGNOSIS — D696 Thrombocytopenia, unspecified: Secondary | ICD-10-CM | POA: Diagnosis not present

## 2022-11-30 DIAGNOSIS — E782 Mixed hyperlipidemia: Secondary | ICD-10-CM

## 2022-11-30 DIAGNOSIS — R7309 Other abnormal glucose: Secondary | ICD-10-CM | POA: Diagnosis not present

## 2022-11-30 DIAGNOSIS — N401 Enlarged prostate with lower urinary tract symptoms: Secondary | ICD-10-CM

## 2022-11-30 DIAGNOSIS — E6609 Other obesity due to excess calories: Secondary | ICD-10-CM

## 2022-11-30 DIAGNOSIS — Z860101 Personal history of adenomatous and serrated colon polyps: Secondary | ICD-10-CM

## 2022-11-30 DIAGNOSIS — H40001 Preglaucoma, unspecified, right eye: Secondary | ICD-10-CM

## 2022-11-30 DIAGNOSIS — I10 Essential (primary) hypertension: Secondary | ICD-10-CM

## 2022-11-30 DIAGNOSIS — E039 Hypothyroidism, unspecified: Secondary | ICD-10-CM

## 2022-11-30 DIAGNOSIS — Z79899 Other long term (current) drug therapy: Secondary | ICD-10-CM | POA: Diagnosis not present

## 2022-11-30 DIAGNOSIS — Z23 Encounter for immunization: Secondary | ICD-10-CM

## 2022-11-30 DIAGNOSIS — Z136 Encounter for screening for cardiovascular disorders: Secondary | ICD-10-CM | POA: Diagnosis not present

## 2022-11-30 DIAGNOSIS — Z125 Encounter for screening for malignant neoplasm of prostate: Secondary | ICD-10-CM

## 2022-11-30 DIAGNOSIS — R351 Nocturia: Secondary | ICD-10-CM | POA: Diagnosis not present

## 2022-11-30 DIAGNOSIS — Z0000 Encounter for general adult medical examination without abnormal findings: Secondary | ICD-10-CM

## 2022-11-30 NOTE — Patient Instructions (Signed)

## 2022-11-30 NOTE — Progress Notes (Signed)
CPE  [name] presents today for an annual physical.  Below references diagnostics and current plan of care:  Assessment & Plan:  Annual Physical Due annually  Health maintenance reviewed Healthily lifestyle goals set  Essential hypertension Continue Doxazosin, Olmesartan Discussed DASH (Dietary Approaches to Stop Hypertension) DASH diet is lower in sodium than a typical American diet. Cut back on foods that are high in saturated fat, cholesterol, and trans fats. Eat more whole-grain foods, fish, poultry, and nuts Remain active and exercise as tolerated daily.  Monitor BP at home-Call if greater than 130/80.  Check CMP/CBC  Hypothyroidism, unspecified type Controlled. Continue Levothyroxine. Reminded to take on an empty stomach 30-61mins before food.  Stop any Biotin Supplement 48-72 hours before next TSH level to reduce the risk of falsely low TSH levels. Continue to monitor.    Hyperlipidemia, mixed Controlled Continue lifestyle modifications. Recommended diet heavy in fruits and veggies, omega 3's. Decrease consumption of animal meats, cheeses, and dairy products. Remain active and exercise as tolerated. Continue to monitor. Check lipids/TSH  Vitamin D deficiency Continue supplement for goal of 60-100 Monitor levels  Abnormal glucose Education: Reviewed 'ABCs' of diabetes management  Discussed goals to be met and/or maintained include A1C (<7) Blood pressure (<130/80) Cholesterol (LDL <70) Continue Eye Exam yearly  Continue Dental Exam Q6 mo Discussed dietary recommendations Discussed Physical Activity recommendations Check A1C  Obesity due to excess calories, unspecified classification, unspecified whether serious comorbidity present Discussed appropriate BMI Diet modification. Physical activity. Encouraged/praised to build confidence.  Hx of adenomatous colonic polyps Continue colon cancer screening - UTD Continue to monitor.  BPH  associated with nocturia Discussed Flomax for symptom control  Following Urology.   Continue to monitor.  Right inguinal hernia Improved - general surgery referral pending. Continue to monitor for strangulations, s/s discussed an reviewed including changes in color of skin, growth, uncontrolled pain. Continue to monitor.  Medication management Medications dicussed an reviewed in detail. All questions and concerns addressed.  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 Continue to follow with [facility] - last 12/2021  Thrombocytopenia Resolved - monitor CBC  Screening for prostate cancer Monitor PSA  Screening for hematuria or proteinuria Check and monitor Microalbumin / creatinine urine ratio Check and monitor Urinalysis, Routine w reflex microscopic  Screening for deficiency anemia Monitor B12  Orders Placed This Encounter  Procedures   Flu vaccine HIGH DOSE PF(Fluzone Trivalent)   CBC with Differential/Platelet   COMPLETE METABOLIC PANEL WITH GFR   Magnesium   Lipid panel   TSH   Hemoglobin A1c   Insulin, random   VITAMIN D 25 Hydroxy (Vit-D Deficiency, Fractures)   Urinalysis, Routine w reflex microscopic   Microalbumin / creatinine urine ratio   Vitamin B12   PSA   EKG 12-Lead   Notify office for further evaluation and treatment, questions or concerns if any reported s/s fail to improve.   The patient was advised to call back or seek an in-person evaluation if any symptoms worsen or if the condition fails to improve as anticipated.   Further disposition pending results of labs. Discussed med's effects and SE's.    I discussed the assessment and treatment plan with the patient. The patient was provided an opportunity to ask questions and all were answered. The patient agreed with the plan and demonstrated an understanding of the instructions.  Discussed med's effects and SE's. Screening labs and tests as requested with regular follow-up as recommended.  I  provided 40 minutes of face-to-face time during  this encounter including counseling, chart review, and critical decision making was preformed.  Future Appointments  Date Time Provider Department Center  11/30/2023  9:00 AM Adela Glimpse, NP [facility] None     Plan:   During the course of the visit the patient was educated and counseled about appropriate screening and preventive services including:   Pneumococcal vaccine  Influenza vaccine Prevnar 13 Td vaccine Screening electrocardiogram Colorectal cancer screening Diabetes screening Glaucoma screening Nutrition counseling    Subjective:  Julian Lee is a 67 y.o. male who presents for annual CPE and 3 month follow up. He has Hypertension; Hyperlipidemia; Hypothyroidism; Vitamin D deficiency; Medication management; Abnormal glucose; Obesity; Hx of adenomatous colonic polyps; BPH associated with nocturia; Preglaucoma of right eye; and Right inguinal hernia on their problem list.  Overall he reports feeling well today.  He has no new or additional concerns at this time.  He had a concern for right inguinal pain that most notabley when walking, however, this has now resolved.  Had referral to General Surgery but has not followed up.    He is followed by Urology has BPH with nocturia.  Has not had referral to General Surgery for review of hernia.   BMI is Body mass index is 31.95 kg/m., he has not been working on diet and exercise. Wt Readings from Last 3 Encounters:  11/30/22 242 lb 3.2 oz (109.9 kg)  06/02/22 222 lb 6.4 oz (100.9 kg)  03/03/22 211 lb 6.4 oz (95.9 kg)   His blood pressure has been controlled at home, today their BP is BP: (!) 122/90 He does workout. He denies chest pain, shortness of breath, dizziness.   He is on cholesterol medication and denies myalgias. His cholesterol is at goal. The cholesterol last visit was:   Lab Results  Component Value Date   CHOL 192 06/02/2022   HDL 56 06/02/2022    LDLCALC 112 (H) 06/02/2022   TRIG 129 06/02/2022   CHOLHDL 3.4 06/02/2022   He has been working on diet and exercise for prediabetes, and denies nausea, polydipsia, and polyuria. Last A1C in the office was:  Lab Results  Component Value Date   HGBA1C 5.3 06/02/2022   Last GFR Lab Results  Component Value Date   EGFR 78 06/02/2022   Patient is on Vitamin D supplement.   Lab Results  Component Value Date   VD25OH 69 06/02/2022      Medication Review:  Current Outpatient Medications (Endocrine & Metabolic):    levothyroxine (SYNTHROID) 175 MCG tablet, TAKE 1 TABLET DAILY ON AN EMPTY STOMACH WITH ONLY WATER FOR 30 MINUTES AND NO ANTACID MEDICATIONS, CALCIUM, OR MAGNESIUM FOR 4 HOURS AND AVOID BIOTIN  Current Outpatient Medications (Cardiovascular):    doxazosin (CARDURA) 2 MG tablet, Take 1 tablet at Bedtime for BP & Prostate   olmesartan-hydrochlorothiazide (BENICAR HCT) 20-12.5 MG tablet, Take 1 tablet Daily for BP   Current Outpatient Medications (Analgesics):    BABY ASPIRIN PO, Take 81 mg by mouth daily.   Current Outpatient Medications (Other):    Ascorbic Acid (VITAMIN C) 1000 MG tablet, Take 1,000 mg by mouth daily.   Cholecalciferol (VITAMIN D PO), Take 2,000 Units by mouth 2 times daily at 12 noon and 4 pm.   Flaxseed, Linseed, (FLAX SEED OIL PO), Take by mouth daily. Takes 2 caps daily   Glucosamine HCl (GLUCOSAMINE PO), Take by mouth. 1000mg    Magnesium 250 MG TABS, Take by mouth.   Multiple Vitamins-Minerals (MULTIVITAMIN ADULTS 50+ PO),  Take by mouth.   zinc gluconate 50 MG tablet, Take 50 mg by mouth daily.   triamcinolone cream (KENALOG) 0.5 %, Apply 1 application topically 2 (two) times daily. (Patient not taking: Reported on 11/30/2022)  Allergies: Allergies  Allergen Reactions   Latex     Current Problems (verified) has Hypertension; Hyperlipidemia; Hypothyroidism; Vitamin D deficiency; Medication management; Abnormal glucose; Obesity; Hx of  adenomatous colonic polyps; BPH associated with nocturia; Preglaucoma of right eye; and Right inguinal hernia on their problem list.  Screening Tests Immunization History  Administered Date(s) Administered   Influenza Inj Mdck Quad With Preservative 11/08/2016, 11/07/2017   Influenza Split 09/26/2012   Influenza, High Dose Seasonal PF 11/12/2018, 09/23/2020, 11/29/2021   Influenza-Unspecified 11/07/2016, 11/07/2017   Moderna Covid-19 Vaccine Bivalent Booster 67yrs & up 10/12/2020   PFIZER(Purple Top)SARS-COV-2 Vaccination 04/04/2019, 04/29/2019   PPD Test 04/01/2013   Pneumococcal Conjugate-13 11/12/2018   Pneumococcal Polysaccharide-23 09/16/2008   Td 08/22/2006   Tdap 11/08/2016   Zoster Recombinant(Shingrix) 08/04/2020, 10/12/2020   Zoster, Live 01/10/2006   Health Maintenance  Topic Date Due   INFLUENZA VACCINE  08/11/2022   COVID-19 Vaccine (4 - 2023-24 season) 09/11/2022   Medicare Annual Wellness (AWV)  03/04/2023   Pneumonia Vaccine 51+ Years old (3 of 3 - PPSV23 or PCV20) 11/12/2023   Colonoscopy  02/24/2025   DTaP/Tdap/Td (3 - Td or Tdap) 11/09/2026   Hepatitis C Screening  Completed   Zoster Vaccines- Shingrix  Completed   HPV VACCINES  Aged Out   Fecal DNA (Cologuard)  Discontinued    Last colonoscopy: 02/2020 Due 5 Years  Names of Other Physician/Practitioners you currently use: 1. [facility] here for primary care 2. [facility] - last visit 2024 - completed bilateral cataract surgery - routine follow up 3. [place] - dentist, 08/2022 - q6 mo 4. Due, derm  Patient Care Team: Lucky Cowboy, MD as PCP - General (Internal Medicine) Iva Boop, MD as Consulting Physician (Gastroenterology) Campbell Stall, MD as Consulting Physician (Dermatology)  Surgical: He  has a past surgical history that includes Tonsillectomy and adenoidectomy and Colonoscopy (2019). Family His family history includes Cancer in  his mother; Heart attack in his father; Rheum arthritis in his mother. Social history  He reports that he quit smoking about 18 years ago. His smoking use included cigarettes. He quit smokeless tobacco use about 32 years ago. He reports current alcohol use. He reports that he does not use drugs.  Objective:   Today's Vitals   11/30/22 0853  BP: (!) 122/90  Pulse: 71  Temp: 98.2 F (36.8 C)  SpO2: 97%  Weight: 242 lb 3.2 oz (109.9 kg)  Height: 6\' 1"  (1.854 m)   Body mass index is 31.95 kg/m.  General appearance: alert, no distress, WD/WN, male HEENT: normocephalic, sclerae anicteric, TMs pearly, nares patent, no discharge or erythema, pharynx normal Oral cavity: MMM, no lesions Neck: supple, no lymphadenopathy, no thyromegaly, no masses Heart: RRR, normal S1, S2, no murmurs Lungs: CTA bilaterally, no wheezes, rhonchi, or rales Abdomen: +bs, soft, non tender, non distended, no masses, no hepatomegaly, no splenomegaly Musculoskeletal: nontender, no swelling, no obvious deformity Extremities: no edema, no cyanosis, no clubbing Pulses: 2+ symmetric, upper and lower extremities, normal cap refill Neurological: alert, oriented x 3, CN2-12 intact, strength normal upper extremities and lower extremities, sensation normal throughout, DTRs 2+ throughout, no cerebellar signs, gait normal Psychiatric: normal affect, behavior normal, pleasant   EKG: NSR   Lakrisha Iseman  Akul Leggette, NP   11/30/2022

## 2022-12-01 LAB — COMPLETE METABOLIC PANEL WITH GFR
AG Ratio: 1.7 (calc) (ref 1.0–2.5)
ALT: 25 U/L (ref 9–46)
AST: 22 U/L (ref 10–35)
Albumin: 4.4 g/dL (ref 3.6–5.1)
Alkaline phosphatase (APISO): 74 U/L (ref 35–144)
BUN: 18 mg/dL (ref 7–25)
CO2: 26 mmol/L (ref 20–32)
Calcium: 9.9 mg/dL (ref 8.6–10.3)
Chloride: 104 mmol/L (ref 98–110)
Creat: 1.07 mg/dL (ref 0.70–1.35)
Globulin: 2.6 g/dL (ref 1.9–3.7)
Glucose, Bld: 95 mg/dL (ref 65–99)
Potassium: 4.1 mmol/L (ref 3.5–5.3)
Sodium: 138 mmol/L (ref 135–146)
Total Bilirubin: 0.6 mg/dL (ref 0.2–1.2)
Total Protein: 7 g/dL (ref 6.1–8.1)
eGFR: 75 mL/min/{1.73_m2} (ref >=60)

## 2022-12-01 LAB — LIPID PANEL
Cholesterol: 207 mg/dL — ABNORMAL HIGH (ref <200)
HDL: 62 mg/dL (ref >=40)
LDL Cholesterol (Calc): 126 mg/dL — ABNORMAL HIGH
Non-HDL Cholesterol (Calc): 145 mg/dL — ABNORMAL HIGH (ref <130)
Total CHOL/HDL Ratio: 3.3 (calc) (ref <5.0)
Triglycerides: 89 mg/dL (ref <150)

## 2022-12-01 LAB — VITAMIN B12: Vitamin B-12: 521 pg/mL (ref 200–1100)

## 2022-12-01 LAB — MICROSCOPIC MESSAGE

## 2022-12-01 LAB — CBC WITH DIFFERENTIAL/PLATELET
Absolute Lymphocytes: 906 {cells}/uL (ref 850–3900)
Absolute Monocytes: 480 {cells}/uL (ref 200–950)
Basophils Absolute: 72 {cells}/uL (ref 0–200)
Basophils Relative: 1.2 %
Eosinophils Absolute: 180 {cells}/uL (ref 15–500)
Eosinophils Relative: 3 %
HCT: 51.6 % — ABNORMAL HIGH (ref 38.5–50.0)
Hemoglobin: 17 g/dL (ref 13.2–17.1)
MCH: 30.1 pg (ref 27.0–33.0)
MCHC: 32.9 g/dL (ref 32.0–36.0)
MCV: 91.5 fL (ref 80.0–100.0)
MPV: 10.6 fL (ref 7.5–12.5)
Monocytes Relative: 8 %
Neutro Abs: 4362 {cells}/uL (ref 1500–7800)
Neutrophils Relative %: 72.7 %
Platelets: 203 10*3/uL (ref 140–400)
RBC: 5.64 10*6/uL (ref 4.20–5.80)
RDW: 12.5 % (ref 11.0–15.0)
Total Lymphocyte: 15.1 %
WBC: 6 10*3/uL (ref 3.8–10.8)

## 2022-12-01 LAB — URINALYSIS, ROUTINE W REFLEX MICROSCOPIC
Bacteria, UA: NONE SEEN /[HPF]
Bilirubin Urine: NEGATIVE
Glucose, UA: NEGATIVE
Hgb urine dipstick: NEGATIVE
Hyaline Cast: NONE SEEN /[LPF]
Ketones, ur: NEGATIVE
Leukocytes,Ua: NEGATIVE
Nitrite: NEGATIVE
Specific Gravity, Urine: 1.021 (ref 1.001–1.035)
Squamous Epithelial / HPF: NONE SEEN /[HPF] (ref <=5)
WBC, UA: NONE SEEN /[HPF] (ref 0–5)
pH: 7 (ref 5.0–8.0)

## 2022-12-01 LAB — MICROALBUMIN / CREATININE URINE RATIO
Creatinine, Urine: 186 mg/dL (ref 20–320)
Microalb Creat Ratio: 22 mg/g{creat} (ref <30)
Microalb, Ur: 4.1 mg/dL

## 2022-12-01 LAB — VITAMIN D 25 HYDROXY (VIT D DEFICIENCY, FRACTURES): Vit D, 25-Hydroxy: 57 ng/mL (ref 30–100)

## 2022-12-01 LAB — HEMOGLOBIN A1C
Hgb A1c MFr Bld: 5.3 %{Hb} (ref <5.7)
Mean Plasma Glucose: 105 mg/dL
eAG (mmol/L): 5.8 mmol/L

## 2022-12-01 LAB — INSULIN, RANDOM: Insulin: 14.2 u[IU]/mL

## 2022-12-01 LAB — TSH: TSH: 2.26 m[IU]/L (ref 0.40–4.50)

## 2022-12-01 LAB — PSA: PSA: 0.66 ng/mL (ref <=4.00)

## 2022-12-01 LAB — MAGNESIUM: Magnesium: 2.1 mg/dL (ref 1.5–2.5)

## 2023-03-23 ENCOUNTER — Ambulatory Visit: Payer: Medicare HMO | Admitting: Nurse Practitioner

## 2023-03-29 DIAGNOSIS — Z13220 Encounter for screening for lipoid disorders: Secondary | ICD-10-CM | POA: Diagnosis not present

## 2023-03-29 DIAGNOSIS — E559 Vitamin D deficiency, unspecified: Secondary | ICD-10-CM | POA: Diagnosis not present

## 2023-03-29 DIAGNOSIS — E039 Hypothyroidism, unspecified: Secondary | ICD-10-CM | POA: Diagnosis not present

## 2023-03-29 DIAGNOSIS — Z6833 Body mass index (BMI) 33.0-33.9, adult: Secondary | ICD-10-CM | POA: Diagnosis not present

## 2023-03-29 DIAGNOSIS — Z0000 Encounter for general adult medical examination without abnormal findings: Secondary | ICD-10-CM | POA: Diagnosis not present

## 2023-03-29 DIAGNOSIS — I10 Essential (primary) hypertension: Secondary | ICD-10-CM | POA: Diagnosis not present

## 2023-03-29 DIAGNOSIS — N401 Enlarged prostate with lower urinary tract symptoms: Secondary | ICD-10-CM | POA: Diagnosis not present

## 2023-06-01 DIAGNOSIS — E559 Vitamin D deficiency, unspecified: Secondary | ICD-10-CM | POA: Diagnosis not present

## 2023-06-01 DIAGNOSIS — Z136 Encounter for screening for cardiovascular disorders: Secondary | ICD-10-CM | POA: Diagnosis not present

## 2023-06-01 DIAGNOSIS — I10 Essential (primary) hypertension: Secondary | ICD-10-CM | POA: Diagnosis not present

## 2023-06-09 DIAGNOSIS — Z0000 Encounter for general adult medical examination without abnormal findings: Secondary | ICD-10-CM | POA: Diagnosis not present

## 2023-06-09 DIAGNOSIS — E039 Hypothyroidism, unspecified: Secondary | ICD-10-CM | POA: Diagnosis not present

## 2023-06-09 DIAGNOSIS — I10 Essential (primary) hypertension: Secondary | ICD-10-CM | POA: Diagnosis not present

## 2023-06-09 DIAGNOSIS — N401 Enlarged prostate with lower urinary tract symptoms: Secondary | ICD-10-CM | POA: Diagnosis not present

## 2023-06-09 DIAGNOSIS — E559 Vitamin D deficiency, unspecified: Secondary | ICD-10-CM | POA: Diagnosis not present

## 2023-06-09 DIAGNOSIS — Z6834 Body mass index (BMI) 34.0-34.9, adult: Secondary | ICD-10-CM | POA: Diagnosis not present

## 2023-06-09 DIAGNOSIS — Z1331 Encounter for screening for depression: Secondary | ICD-10-CM | POA: Diagnosis not present

## 2023-06-09 DIAGNOSIS — E782 Mixed hyperlipidemia: Secondary | ICD-10-CM | POA: Diagnosis not present

## 2023-11-30 ENCOUNTER — Encounter: Payer: Medicare HMO | Admitting: Nurse Practitioner

## 2024-02-14 NOTE — Progress Notes (Signed)
 Julian Lee                                          MRN: [id number]   02/14/2024   The [facility] Quality Team Specialist reviewed this patient medical record for the purposes of chart review for care gap closure. The following were reviewed: chart review for care gap closure-controlling blood pressure.    [facility] Quality Team
